# Patient Record
Sex: Female | Born: 1970 | Race: White | Hispanic: No | Marital: Married | Smoking: Former smoker
Health system: Southern US, Community
[De-identification: ages and names within clinical notes are randomized; demographics above are authoritative.]

## PROBLEM LIST (undated history)

## (undated) DIAGNOSIS — R002 Palpitations: Secondary | ICD-10-CM

## (undated) DIAGNOSIS — E119 Type 2 diabetes mellitus without complications: Secondary | ICD-10-CM

## (undated) DIAGNOSIS — E785 Hyperlipidemia, unspecified: Secondary | ICD-10-CM

## (undated) DIAGNOSIS — D649 Anemia, unspecified: Secondary | ICD-10-CM

## (undated) HISTORY — DX: Palpitations: R00.2

## (undated) HISTORY — PX: ENDOMETRIAL BIOPSY: SHX622

## (undated) HISTORY — DX: Hyperlipidemia, unspecified: E78.5

## (undated) HISTORY — DX: Type 2 diabetes mellitus without complications: E11.9

## (undated) HISTORY — DX: Anemia, unspecified: D64.9

---

## 2003-11-20 ENCOUNTER — Ambulatory Visit (HOSPITAL_COMMUNITY): Admission: RE | Admit: 2003-11-20 | Discharge: 2003-11-20 | Payer: Self-pay | Admitting: Gynecology

## 2003-11-20 ENCOUNTER — Encounter (INDEPENDENT_AMBULATORY_CARE_PROVIDER_SITE_OTHER): Payer: Self-pay | Admitting: Specialist

## 2004-01-30 ENCOUNTER — Inpatient Hospital Stay (HOSPITAL_COMMUNITY): Admission: AD | Admit: 2004-01-30 | Discharge: 2004-02-01 | Payer: Self-pay | Admitting: Gynecology

## 2004-02-13 ENCOUNTER — Ambulatory Visit: Payer: Self-pay | Admitting: Gynecology

## 2004-03-02 ENCOUNTER — Ambulatory Visit: Payer: Self-pay | Admitting: Gynecology

## 2004-04-02 ENCOUNTER — Ambulatory Visit: Payer: Self-pay | Admitting: Gynecology

## 2004-04-24 ENCOUNTER — Ambulatory Visit (HOSPITAL_COMMUNITY): Admission: RE | Admit: 2004-04-24 | Discharge: 2004-04-24 | Payer: Self-pay | Admitting: Gynecology

## 2004-04-25 ENCOUNTER — Encounter (INDEPENDENT_AMBULATORY_CARE_PROVIDER_SITE_OTHER): Payer: Self-pay | Admitting: Specialist

## 2004-04-25 ENCOUNTER — Ambulatory Visit (HOSPITAL_COMMUNITY): Admission: RE | Admit: 2004-04-25 | Discharge: 2004-04-25 | Payer: Self-pay | Admitting: Gynecology

## 2005-08-30 ENCOUNTER — Encounter: Payer: Self-pay | Admitting: Family Medicine

## 2005-08-30 LAB — CONVERTED CEMR LAB: Pap Smear: NORMAL

## 2005-09-22 ENCOUNTER — Encounter (INDEPENDENT_AMBULATORY_CARE_PROVIDER_SITE_OTHER): Payer: Self-pay | Admitting: *Deleted

## 2005-09-22 ENCOUNTER — Ambulatory Visit: Payer: Self-pay | Admitting: Gynecology

## 2005-10-08 ENCOUNTER — Ambulatory Visit: Payer: Self-pay | Admitting: Obstetrics & Gynecology

## 2005-10-22 ENCOUNTER — Ambulatory Visit: Payer: Self-pay | Admitting: Obstetrics & Gynecology

## 2005-12-21 ENCOUNTER — Ambulatory Visit: Payer: Self-pay | Admitting: Family Medicine

## 2005-12-31 ENCOUNTER — Encounter: Payer: Self-pay | Admitting: Family Medicine

## 2005-12-31 LAB — CONVERTED CEMR LAB: Hgb A1c MFr Bld: 5.6 %

## 2006-01-07 ENCOUNTER — Ambulatory Visit: Payer: Self-pay | Admitting: Family Medicine

## 2006-02-02 ENCOUNTER — Ambulatory Visit: Payer: Self-pay | Admitting: Family Medicine

## 2006-04-08 ENCOUNTER — Ambulatory Visit: Payer: Self-pay | Admitting: Obstetrics & Gynecology

## 2006-04-16 ENCOUNTER — Ambulatory Visit (HOSPITAL_COMMUNITY): Admission: RE | Admit: 2006-04-16 | Discharge: 2006-04-16 | Payer: Self-pay | Admitting: Gynecology

## 2006-05-06 ENCOUNTER — Ambulatory Visit: Payer: Self-pay | Admitting: Obstetrics & Gynecology

## 2006-05-06 ENCOUNTER — Encounter: Payer: Self-pay | Admitting: Obstetrics & Gynecology

## 2006-05-10 ENCOUNTER — Ambulatory Visit: Payer: Self-pay | Admitting: Obstetrics & Gynecology

## 2006-05-13 ENCOUNTER — Ambulatory Visit: Payer: Self-pay | Admitting: Obstetrics & Gynecology

## 2006-05-18 ENCOUNTER — Ambulatory Visit: Payer: Self-pay | Admitting: Family Medicine

## 2006-05-28 ENCOUNTER — Ambulatory Visit (HOSPITAL_COMMUNITY): Admission: RE | Admit: 2006-05-28 | Discharge: 2006-05-28 | Payer: Self-pay | Admitting: Gynecology

## 2006-06-01 ENCOUNTER — Ambulatory Visit (HOSPITAL_COMMUNITY): Admission: RE | Admit: 2006-06-01 | Discharge: 2006-06-01 | Payer: Self-pay | Admitting: Gynecology

## 2006-06-01 ENCOUNTER — Ambulatory Visit: Payer: Self-pay | Admitting: Family Medicine

## 2006-06-14 ENCOUNTER — Ambulatory Visit: Payer: Self-pay | Admitting: Gynecology

## 2006-06-28 ENCOUNTER — Ambulatory Visit: Payer: Self-pay | Admitting: Gynecology

## 2006-07-09 ENCOUNTER — Ambulatory Visit (HOSPITAL_COMMUNITY): Admission: RE | Admit: 2006-07-09 | Discharge: 2006-07-09 | Payer: Self-pay | Admitting: Gynecology

## 2006-07-19 ENCOUNTER — Ambulatory Visit: Payer: Self-pay | Admitting: Gynecology

## 2006-07-25 ENCOUNTER — Ambulatory Visit: Payer: Self-pay | Admitting: Obstetrics & Gynecology

## 2006-08-02 ENCOUNTER — Ambulatory Visit: Payer: Self-pay | Admitting: Gynecology

## 2006-08-09 ENCOUNTER — Ambulatory Visit: Payer: Self-pay | Admitting: Obstetrics & Gynecology

## 2006-08-16 ENCOUNTER — Ambulatory Visit: Payer: Self-pay | Admitting: Gynecology

## 2006-08-23 ENCOUNTER — Ambulatory Visit: Payer: Self-pay | Admitting: Gynecology

## 2006-09-06 ENCOUNTER — Ambulatory Visit: Payer: Self-pay | Admitting: Gynecology

## 2006-09-10 ENCOUNTER — Ambulatory Visit (HOSPITAL_COMMUNITY): Admission: RE | Admit: 2006-09-10 | Discharge: 2006-09-10 | Payer: Self-pay | Admitting: Gynecology

## 2006-09-13 ENCOUNTER — Ambulatory Visit: Payer: Self-pay | Admitting: Gynecology

## 2006-09-20 ENCOUNTER — Ambulatory Visit: Payer: Self-pay | Admitting: Gynecology

## 2006-10-04 ENCOUNTER — Ambulatory Visit: Payer: Self-pay | Admitting: Gynecology

## 2006-10-08 ENCOUNTER — Ambulatory Visit (HOSPITAL_COMMUNITY): Admission: RE | Admit: 2006-10-08 | Discharge: 2006-10-08 | Payer: Self-pay | Admitting: Gynecology

## 2006-10-11 ENCOUNTER — Ambulatory Visit: Payer: Self-pay | Admitting: Gynecology

## 2006-10-18 ENCOUNTER — Ambulatory Visit: Payer: Self-pay | Admitting: Gynecology

## 2006-10-21 ENCOUNTER — Ambulatory Visit (HOSPITAL_COMMUNITY): Admission: RE | Admit: 2006-10-21 | Discharge: 2006-10-21 | Payer: Self-pay | Admitting: Gynecology

## 2006-10-25 ENCOUNTER — Ambulatory Visit: Payer: Self-pay | Admitting: Gynecology

## 2006-10-28 ENCOUNTER — Ambulatory Visit (HOSPITAL_COMMUNITY): Admission: RE | Admit: 2006-10-28 | Discharge: 2006-10-28 | Payer: Self-pay | Admitting: Gynecology

## 2006-11-02 ENCOUNTER — Ambulatory Visit: Payer: Self-pay | Admitting: Obstetrics & Gynecology

## 2006-11-04 ENCOUNTER — Ambulatory Visit (HOSPITAL_COMMUNITY): Admission: RE | Admit: 2006-11-04 | Discharge: 2006-11-04 | Payer: Self-pay | Admitting: Gynecology

## 2006-11-09 ENCOUNTER — Ambulatory Visit: Payer: Self-pay | Admitting: Obstetrics & Gynecology

## 2006-11-11 ENCOUNTER — Ambulatory Visit (HOSPITAL_COMMUNITY): Admission: RE | Admit: 2006-11-11 | Discharge: 2006-11-11 | Payer: Self-pay | Admitting: Gynecology

## 2006-11-16 ENCOUNTER — Ambulatory Visit: Payer: Self-pay | Admitting: Family Medicine

## 2006-11-18 ENCOUNTER — Ambulatory Visit (HOSPITAL_COMMUNITY): Admission: RE | Admit: 2006-11-18 | Discharge: 2006-11-18 | Payer: Self-pay | Admitting: Gynecology

## 2006-11-23 ENCOUNTER — Ambulatory Visit: Payer: Self-pay | Admitting: Family Medicine

## 2006-11-25 ENCOUNTER — Ambulatory Visit (HOSPITAL_COMMUNITY): Admission: RE | Admit: 2006-11-25 | Discharge: 2006-11-25 | Payer: Self-pay | Admitting: Gynecology

## 2006-11-29 ENCOUNTER — Ambulatory Visit: Payer: Self-pay | Admitting: Gynecology

## 2006-12-01 LAB — CONVERTED CEMR LAB: Pap Smear: NORMAL

## 2006-12-02 ENCOUNTER — Inpatient Hospital Stay (HOSPITAL_COMMUNITY): Admission: RE | Admit: 2006-12-02 | Discharge: 2006-12-04 | Payer: Self-pay | Admitting: Family Medicine

## 2006-12-02 ENCOUNTER — Ambulatory Visit: Payer: Self-pay | Admitting: Family Medicine

## 2006-12-02 ENCOUNTER — Encounter: Payer: Self-pay | Admitting: Family Medicine

## 2006-12-09 ENCOUNTER — Ambulatory Visit: Payer: Self-pay | Admitting: Obstetrics & Gynecology

## 2007-05-13 ENCOUNTER — Encounter: Payer: Self-pay | Admitting: Family Medicine

## 2007-05-30 ENCOUNTER — Encounter: Payer: Self-pay | Admitting: Family Medicine

## 2007-07-14 ENCOUNTER — Encounter: Payer: Self-pay | Admitting: Family Medicine

## 2007-07-14 ENCOUNTER — Ambulatory Visit: Payer: Self-pay | Admitting: Family Medicine

## 2007-07-14 DIAGNOSIS — E11319 Type 2 diabetes mellitus with unspecified diabetic retinopathy without macular edema: Secondary | ICD-10-CM | POA: Insufficient documentation

## 2007-07-14 DIAGNOSIS — E1169 Type 2 diabetes mellitus with other specified complication: Secondary | ICD-10-CM | POA: Insufficient documentation

## 2007-07-14 DIAGNOSIS — E119 Type 2 diabetes mellitus without complications: Secondary | ICD-10-CM | POA: Insufficient documentation

## 2007-07-14 DIAGNOSIS — Z862 Personal history of diseases of the blood and blood-forming organs and certain disorders involving the immune mechanism: Secondary | ICD-10-CM | POA: Insufficient documentation

## 2007-07-14 DIAGNOSIS — E785 Hyperlipidemia, unspecified: Secondary | ICD-10-CM

## 2007-07-19 ENCOUNTER — Ambulatory Visit: Payer: Self-pay | Admitting: Family Medicine

## 2007-07-27 LAB — CONVERTED CEMR LAB
ALT: 15 units/L (ref 0–35)
Alkaline Phosphatase: 58 units/L (ref 39–117)
Bilirubin, Direct: 0.1 mg/dL (ref 0.0–0.3)
CO2: 27 meq/L (ref 19–32)
Chloride: 106 meq/L (ref 96–112)
Direct LDL: 143.9 mg/dL
Ferritin: 26.8 ng/mL (ref 10.0–291.0)
Glucose, Bld: 99 mg/dL (ref 70–99)
Hgb A1c MFr Bld: 6.3 % — ABNORMAL HIGH (ref 4.6–6.0)
Microalb Creat Ratio: 12.1 mg/g (ref 0.0–30.0)
Potassium: 3.8 meq/L (ref 3.5–5.1)
Saturation Ratios: 26.5 % (ref 20.0–50.0)
Sodium: 142 meq/L (ref 135–145)
Total Protein: 7.3 g/dL (ref 6.0–8.3)
Transferrin: 269.1 mg/dL (ref >=212.0)

## 2007-10-02 IMAGING — US US FETAL BPP W/O NONSTRESS
1 series · 14 of 27 positions shown · non-contrast
Comparison: none

OBSTETRICAL ULTRASOUND:
 This ultrasound was performed in The [HOSPITAL], and the [facility] report will be stored to [REDACTED] PACS.

[Series 1: us fetal bpp w/o nonstress · 14 of 27 slices shown]
[im 1/27]
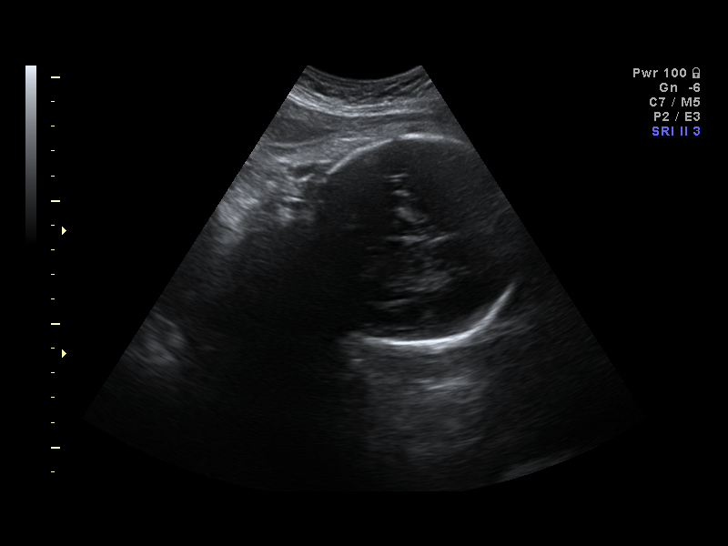
[im 3/27]
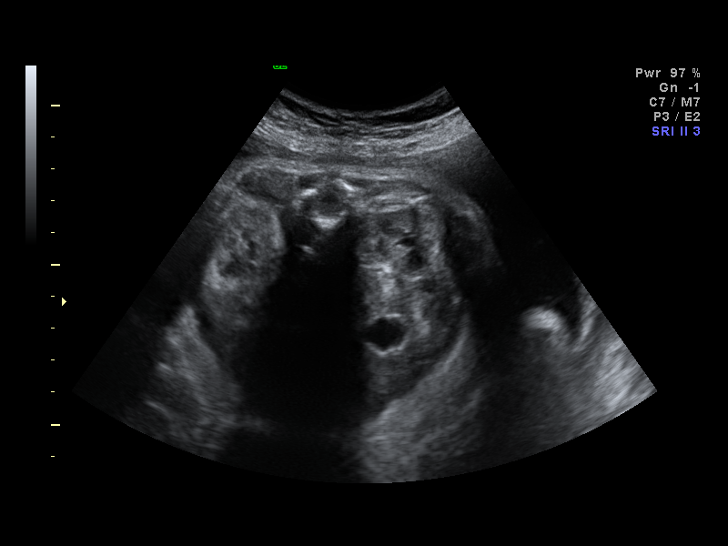
[im 5/27]
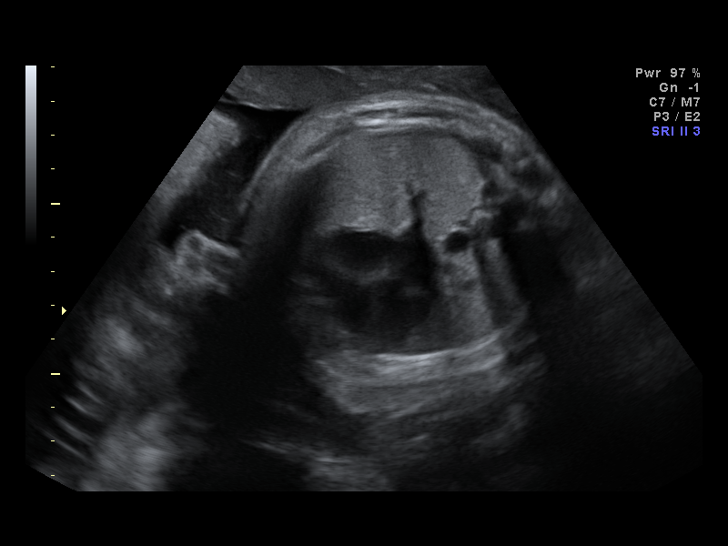
[im 7/27]
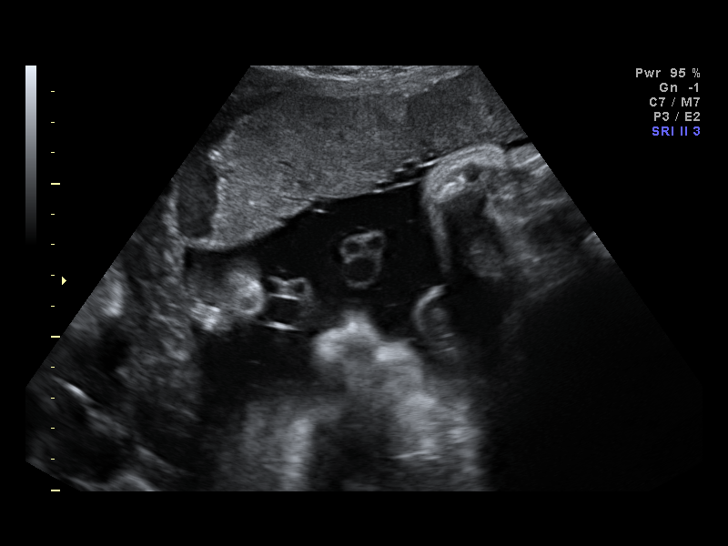
[im 9/27]
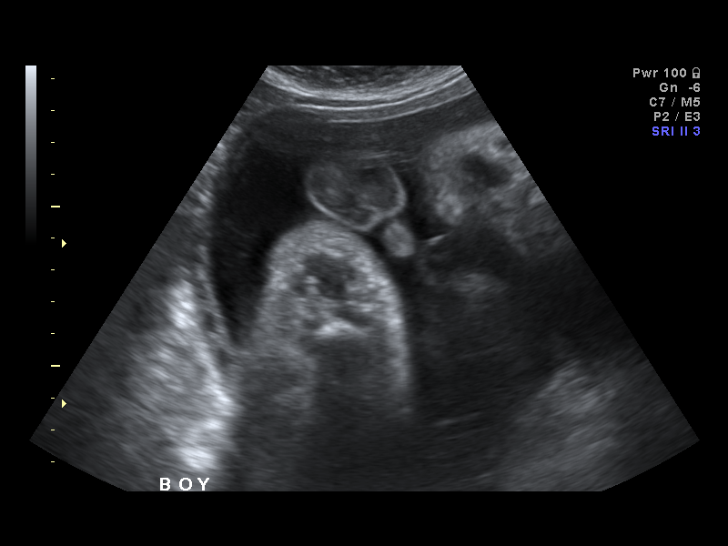
[im 11/27]
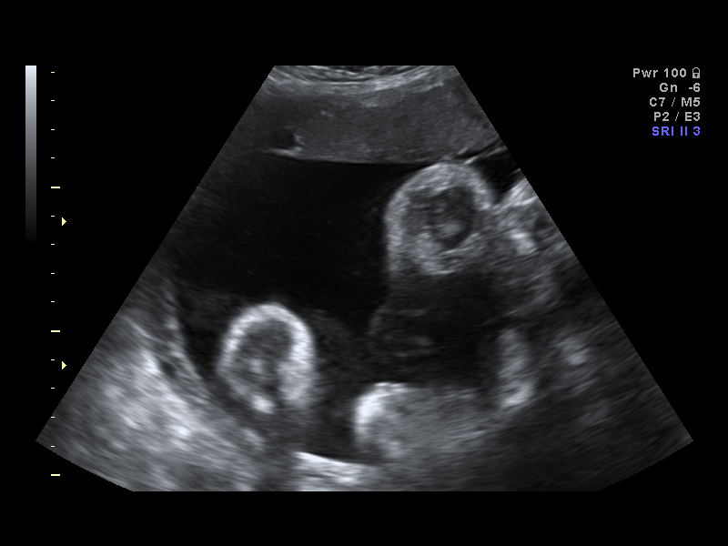
[im 13/27]
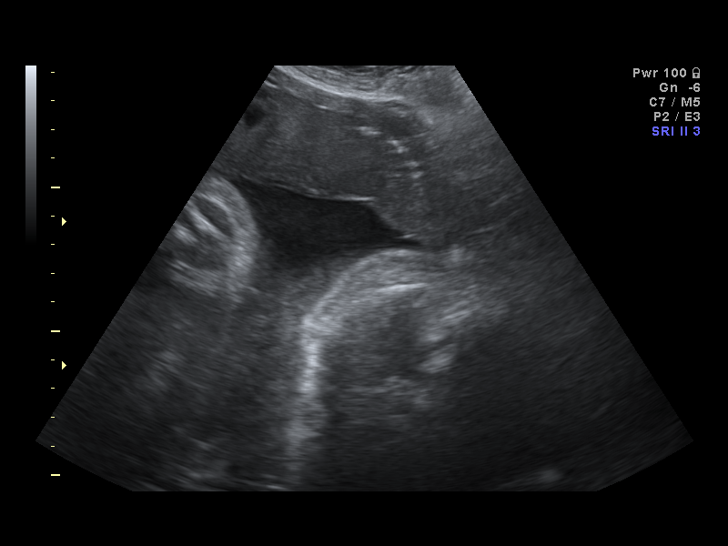
[im 15/27]
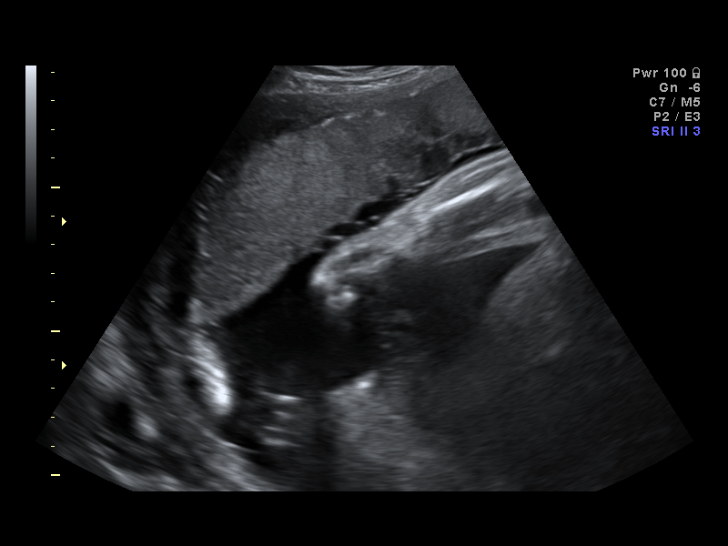
[im 17/27]
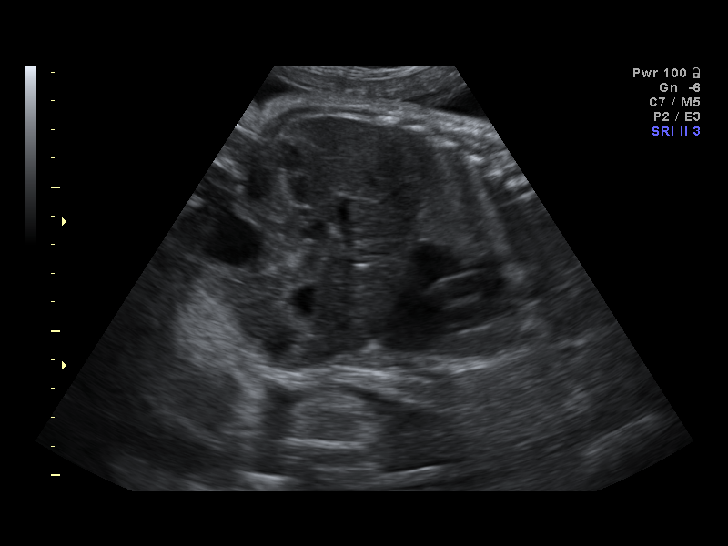
[im 19/27]
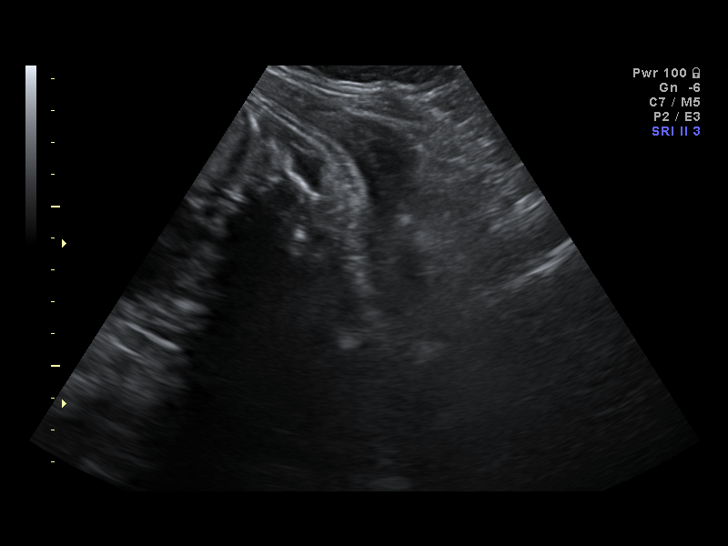
[im 21/27]
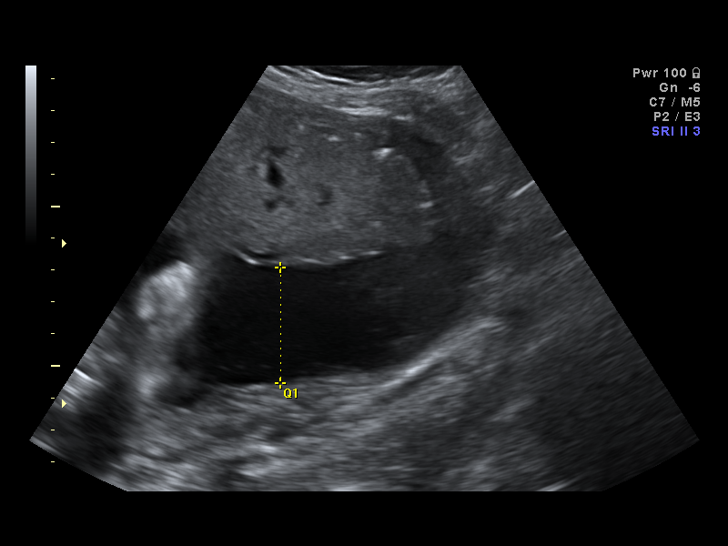
[im 23/27]
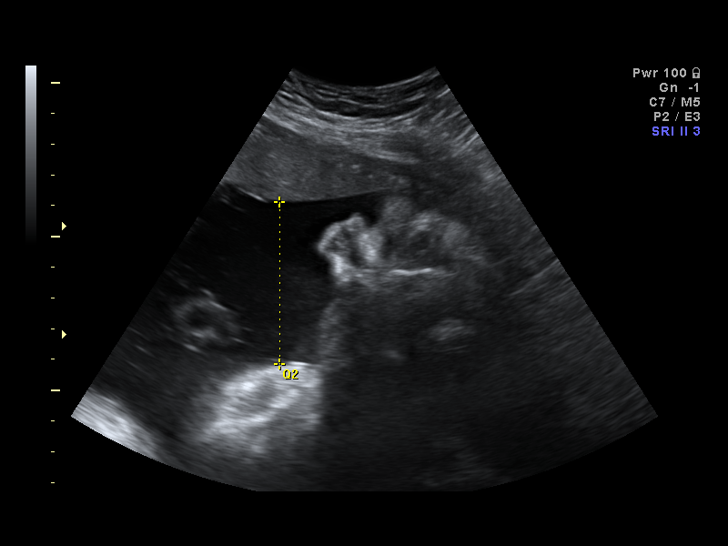
[im 25/27]
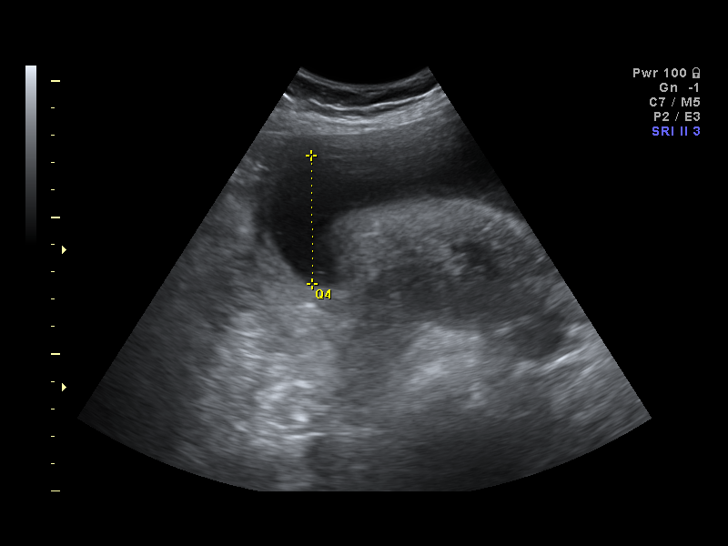
[im 27/27]
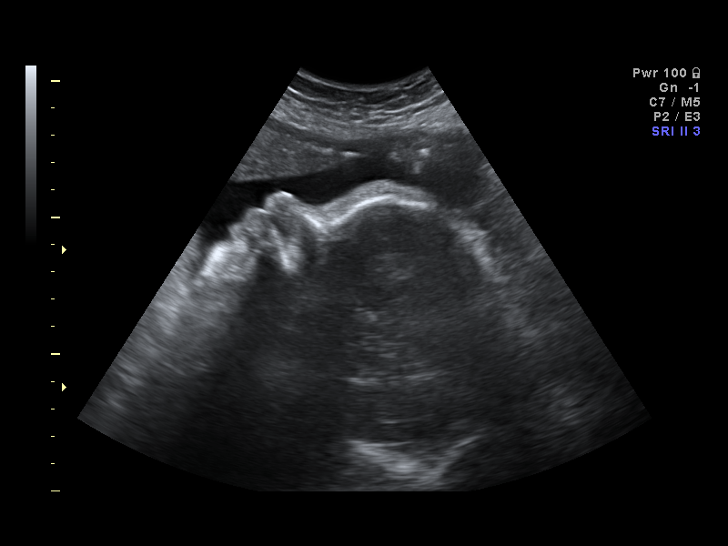

[14 of 27 positions shown; findings below may reference images not displayed]

IMPRESSION: The [facility] report has also been faxed to the ordering physician.

## 2007-10-12 ENCOUNTER — Encounter (INDEPENDENT_AMBULATORY_CARE_PROVIDER_SITE_OTHER): Payer: Self-pay | Admitting: *Deleted

## 2007-10-27 ENCOUNTER — Ambulatory Visit: Payer: Self-pay | Admitting: Family Medicine

## 2007-10-30 IMAGING — US US FETAL BPP W/O NONSTRESS
1 series · 14 of 18 positions shown · non-contrast
Comparison: none

OBSTETRICAL ULTRASOUND:
 This ultrasound was performed in The [HOSPITAL], and the [facility] report will be stored to [REDACTED] PACS.

[Series 1: us fetal bpp w/o nonstress · 14 of 18 slices shown]
[im 1/18]
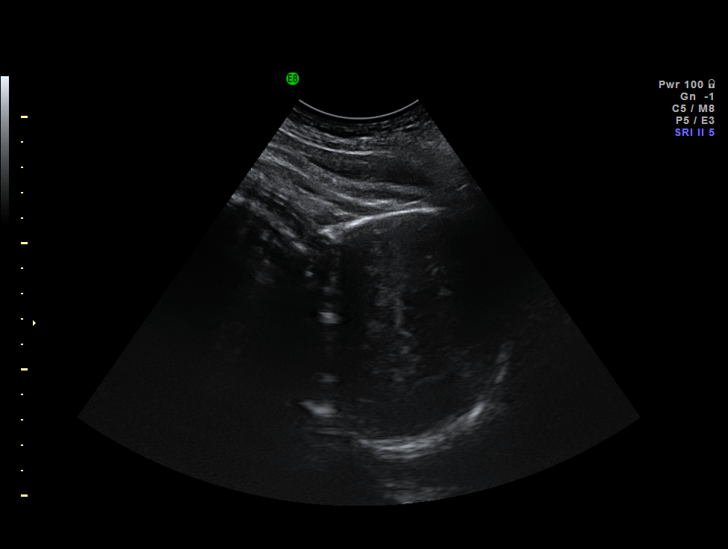
[im 2/18]
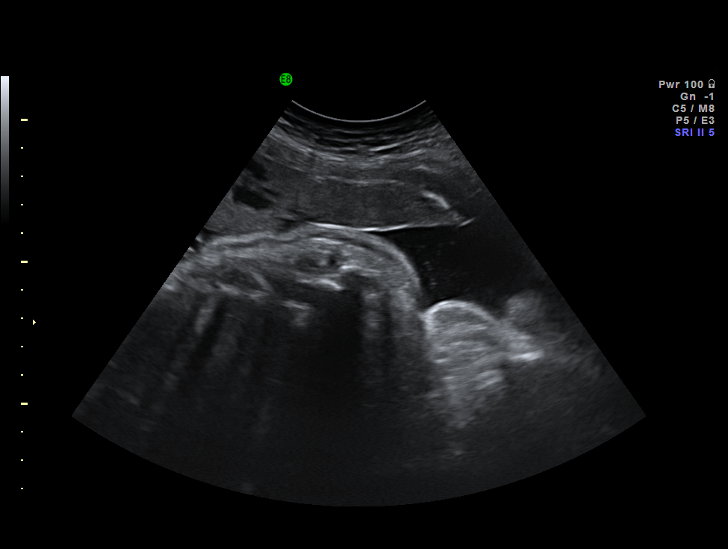
[im 4/18]
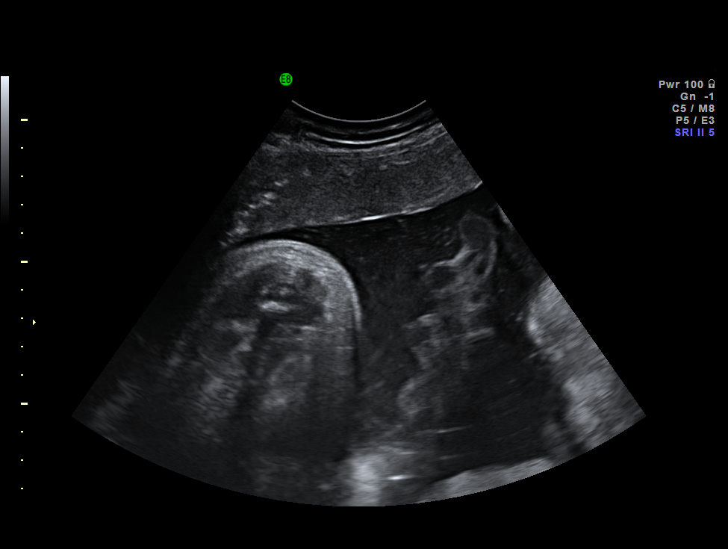
[im 5/18]
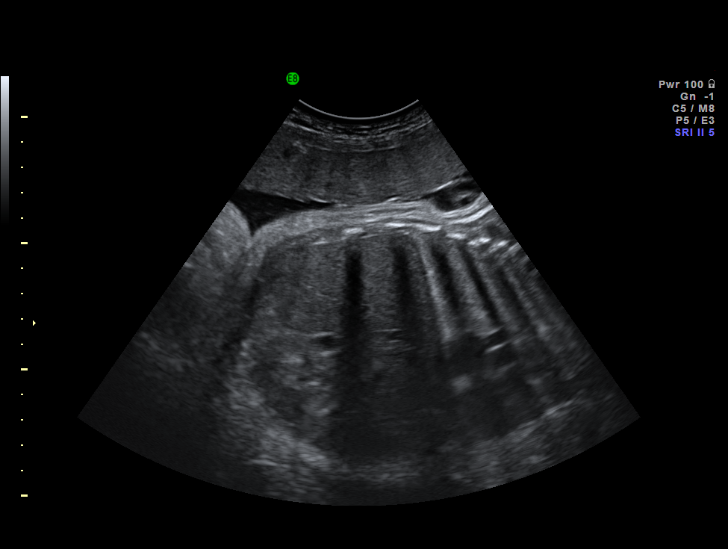
[im 6/18]
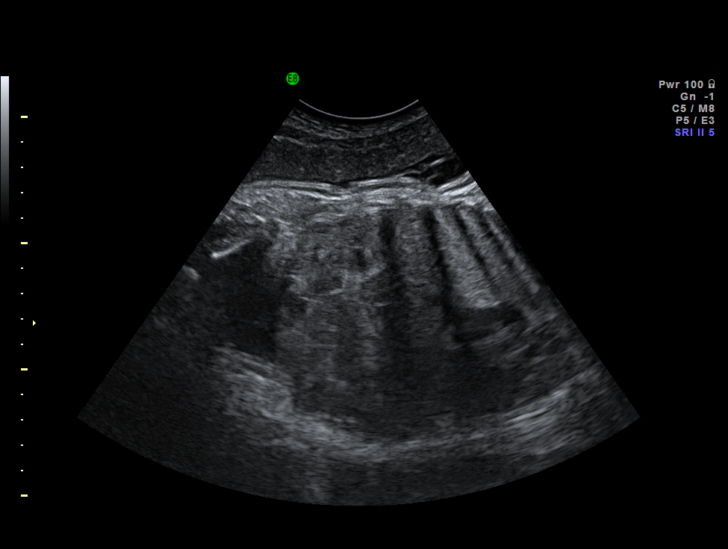
[im 8/18]
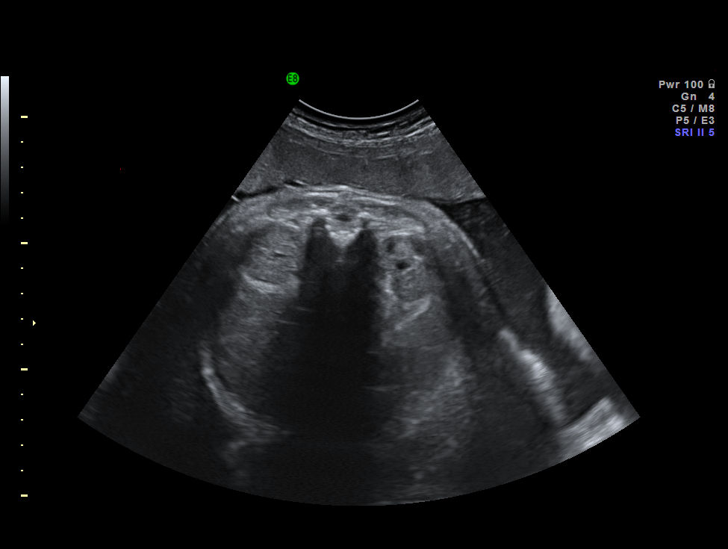
[im 9/18]
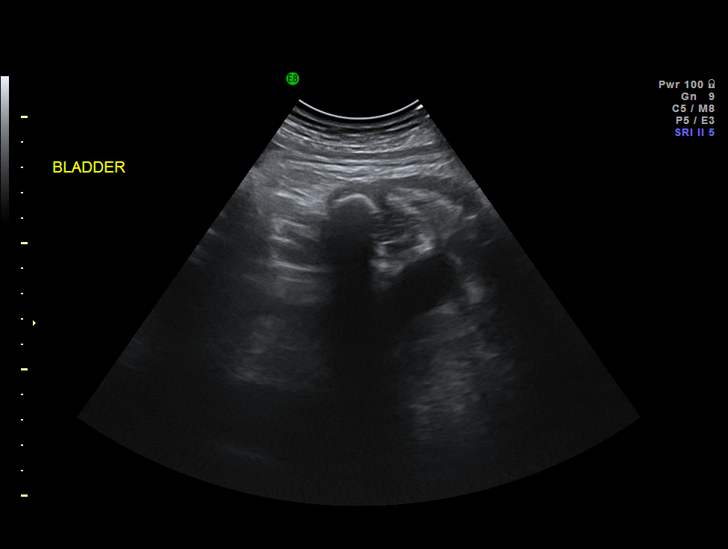
[im 10/18]
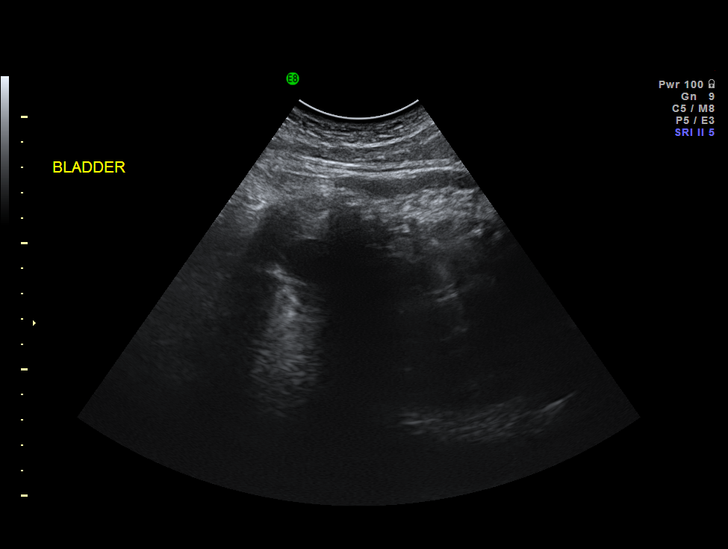
[im 11/18]
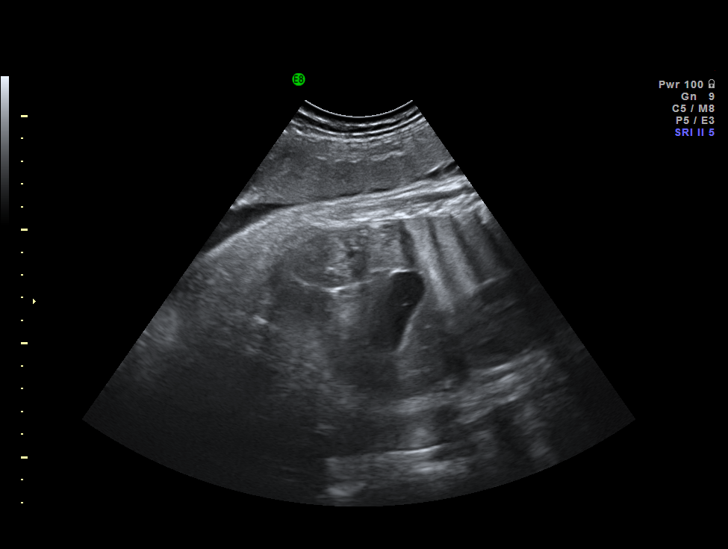
[im 13/18]
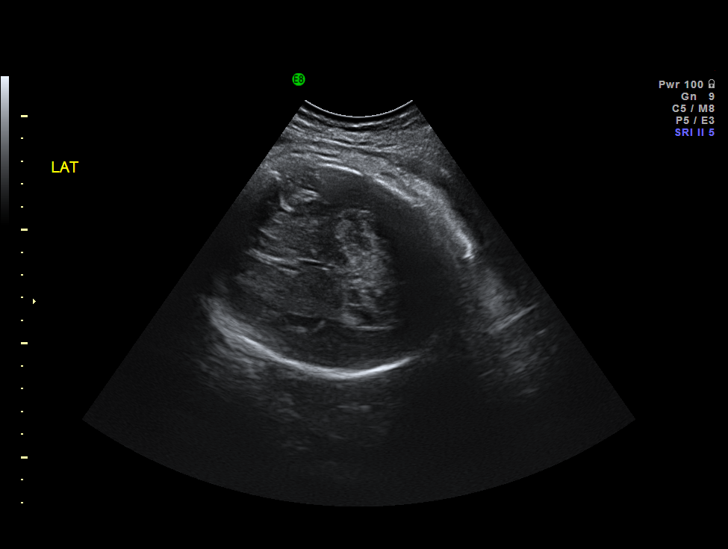
[im 14/18]
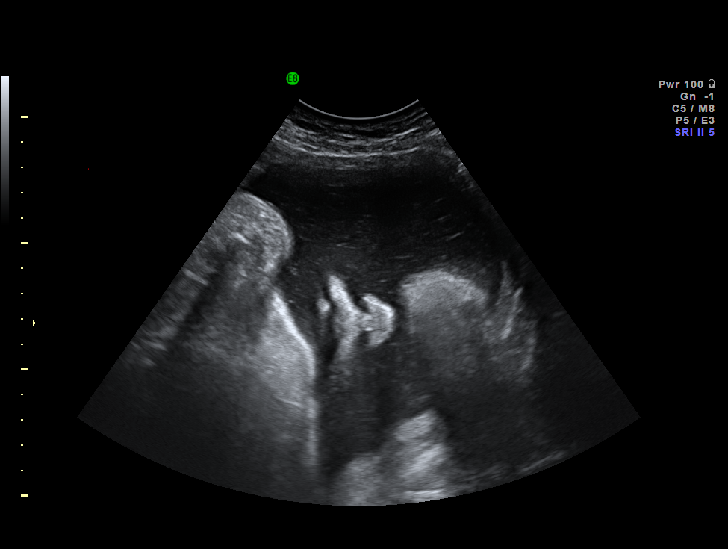
[im 15/18]
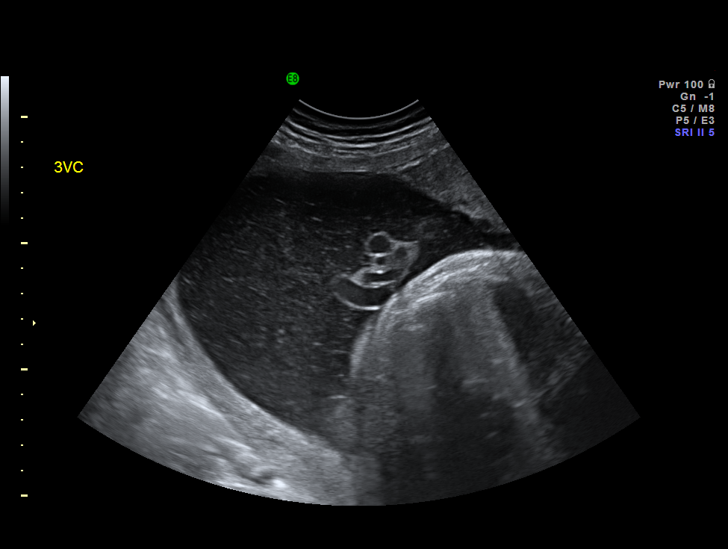
[im 17/18]
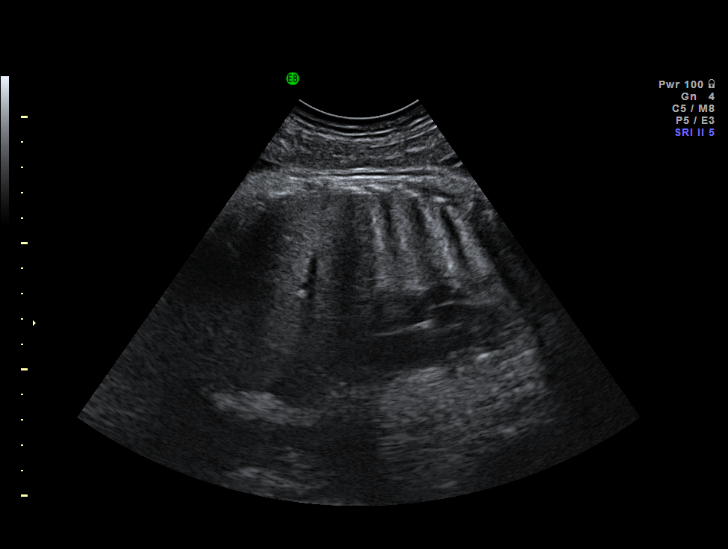
[im 18/18]
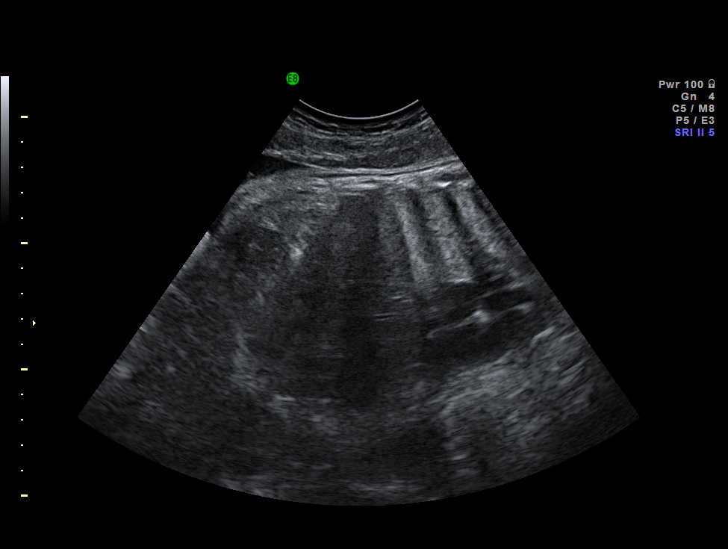

[14 of 18 positions shown; findings below may reference images not displayed]

IMPRESSION: The [facility] report has also been faxed to the ordering physician.

## 2007-11-14 LAB — CONVERTED CEMR LAB
ALT: 9 units/L (ref 0–35)
AST: 12 units/L (ref 0–37)
LDL Cholesterol: 112 mg/dL — ABNORMAL HIGH (ref 0–99)
Total CHOL/HDL Ratio: 2.8

## 2008-02-13 ENCOUNTER — Encounter (INDEPENDENT_AMBULATORY_CARE_PROVIDER_SITE_OTHER): Payer: Self-pay | Admitting: *Deleted

## 2008-05-15 ENCOUNTER — Ambulatory Visit: Payer: Self-pay | Admitting: Family Medicine

## 2008-06-21 ENCOUNTER — Ambulatory Visit: Payer: Self-pay | Admitting: Family Medicine

## 2008-09-11 ENCOUNTER — Ambulatory Visit: Payer: Self-pay | Admitting: Family Medicine

## 2008-09-11 LAB — CONVERTED CEMR LAB
ALT: 11 units/L (ref 0–35)
AST: 13 units/L (ref 0–37)
Albumin: 3.9 g/dL (ref 3.5–5.2)
Alkaline Phosphatase: 49 units/L (ref 39–117)
Bilirubin, Direct: 0.1 mg/dL (ref 0.0–0.3)
CO2: 27 meq/L (ref 19–32)
Calcium: 8.9 mg/dL (ref 8.4–10.5)
Chloride: 105 meq/L (ref 96–112)
Creatinine,U: 256.3 mg/dL
Direct LDL: 135.6 mg/dL
Glucose, Bld: 175 mg/dL — ABNORMAL HIGH (ref 70–99)
LDL Cholesterol: 135.6 mg/dL
LDL Cholesterol: 135.6 mg/dL
Sodium: 139 meq/L (ref 135–145)
Total Protein: 7 g/dL (ref 6.0–8.3)
Triglycerides: 77 mg/dL (ref 0.0–149.0)

## 2008-09-17 ENCOUNTER — Encounter: Payer: Self-pay | Admitting: Family Medicine

## 2008-09-17 ENCOUNTER — Ambulatory Visit: Payer: Self-pay | Admitting: Family Medicine

## 2008-09-17 ENCOUNTER — Other Ambulatory Visit: Admission: RE | Admit: 2008-09-17 | Discharge: 2008-09-17 | Payer: Self-pay | Admitting: Family Medicine

## 2008-09-20 ENCOUNTER — Encounter (INDEPENDENT_AMBULATORY_CARE_PROVIDER_SITE_OTHER): Payer: Self-pay | Admitting: *Deleted

## 2008-12-26 ENCOUNTER — Ambulatory Visit: Payer: Self-pay | Admitting: Family Medicine

## 2008-12-26 LAB — CONVERTED CEMR LAB
ALT: 11 units/L (ref 0–35)
AST: 13 units/L (ref 0–37)
Alkaline Phosphatase: 32 units/L — ABNORMAL LOW (ref 39–117)
Bilirubin, Direct: 0.1 mg/dL (ref 0.0–0.3)
Chloride: 107 meq/L (ref 96–112)
GFR calc non Af Amer: 148.31 mL/min (ref >60)
Hgb A1c MFr Bld: 6.4 % (ref 4.6–6.5)
Potassium: 3.7 meq/L (ref 3.5–5.1)
Sodium: 141 meq/L (ref 135–145)
Total Bilirubin: 1 mg/dL (ref 0.3–1.2)
Total CHOL/HDL Ratio: 4
VLDL: 8.4 mg/dL (ref 0.0–40.0)

## 2009-01-01 ENCOUNTER — Ambulatory Visit: Payer: Self-pay | Admitting: Family Medicine

## 2009-01-01 LAB — CONVERTED CEMR LAB: LDL Cholesterol: 135.6 mg/dL

## 2009-04-15 ENCOUNTER — Encounter (INDEPENDENT_AMBULATORY_CARE_PROVIDER_SITE_OTHER): Payer: Self-pay | Admitting: *Deleted

## 2009-08-02 ENCOUNTER — Ambulatory Visit: Payer: Self-pay | Admitting: Family Medicine

## 2009-08-02 LAB — CONVERTED CEMR LAB
Cholesterol: 238 mg/dL — ABNORMAL HIGH (ref 0–200)
Direct LDL: 154.9 mg/dL
Total CHOL/HDL Ratio: 4
VLDL: 17 mg/dL (ref 0.0–40.0)

## 2009-08-07 ENCOUNTER — Ambulatory Visit: Payer: Self-pay | Admitting: Family Medicine

## 2009-08-07 DIAGNOSIS — E282 Polycystic ovarian syndrome: Secondary | ICD-10-CM | POA: Insufficient documentation

## 2009-11-01 ENCOUNTER — Ambulatory Visit: Payer: Self-pay | Admitting: Family Medicine

## 2009-11-01 LAB — CONVERTED CEMR LAB
AST: 14 units/L (ref 0–37)
Albumin: 3.8 g/dL (ref 3.5–5.2)
Alkaline Phosphatase: 42 units/L (ref 39–117)
Bilirubin, Direct: 0.1 mg/dL (ref 0.0–0.3)
CO2: 26 meq/L (ref 19–32)
Calcium: 8.7 mg/dL (ref 8.4–10.5)
GFR calc non Af Amer: 158.54 mL/min (ref >60)
Glucose, Bld: 115 mg/dL — ABNORMAL HIGH (ref 70–99)
HDL: 57.7 mg/dL (ref >39.00)
Hgb A1c MFr Bld: 7.3 % — ABNORMAL HIGH (ref 4.6–6.5)
Microalb, Ur: 3.1 mg/dL — ABNORMAL HIGH (ref 0.0–1.9)
Potassium: 3.8 meq/L (ref 3.5–5.1)
Sodium: 137 meq/L (ref 135–145)
Total CHOL/HDL Ratio: 3
VLDL: 11 mg/dL (ref 0.0–40.0)

## 2009-11-08 ENCOUNTER — Ambulatory Visit: Payer: Self-pay | Admitting: Family Medicine

## 2010-02-28 ENCOUNTER — Ambulatory Visit: Admit: 2010-02-28 | Payer: Self-pay | Admitting: Family Medicine

## 2010-03-04 ENCOUNTER — Ambulatory Visit: Admit: 2010-03-04 | Payer: Self-pay | Admitting: Family Medicine

## 2010-03-23 ENCOUNTER — Encounter: Payer: Self-pay | Admitting: Gynecology

## 2010-04-02 NOTE — Assessment & Plan Note (Signed)
Summary: F/U/CLE   Vital Signs:  Patient profile:   40 year old female Height:      65.5 inches Weight:      156.0 pounds BMI:     25.66 Temp:     98.0 degrees F oral Pulse rate:   72 / minute Pulse rhythm:   regular BP sitting:   120 / 70  (left arm) Cuff size:   regular  Vitals Entered By: Benny Lennert CMA Duncan Dull) (August 07, 2009 8:26 AM)  History of Present Illness: Chief complaint follow up  DM.Marland Kitchenworsened control again...14lb weight gain!! Continues to use Lantus on 24 UNits...FBS 125-130 Has been out of metformin for several weeks. Sometimes feels bad when she is 85-90. Not exercise.   Problems Prior to Update: 1)  Preventive Health Care  (ICD-V70.0) 2)  Anemia  (ICD-285.9) 3)  Hyperlipidemia  (ICD-272.4) 4)  Diabetes Mellitus, Type II  (ICD-250.00)  Current Medications (verified): 1)  Metformin Hcl 750 Mg Xr24h-Tab (Metformin Hcl) .... 2 Tab By Mouth Daily 2)  Truetest Test  Strp (Glucose Blood) .... Check Cbgs 3 Times Daily  Dx 250.00 3)  Lantus Solostar 100 Unit/ml Soln (Insulin Glargine) .Marland Kitchen.. 10 Units [place] Inj Daily, May Titrate Up 2 Units Every 2 Days Until Fasting Blood Sugar Less Than 120 4)  Pen Needles 31g X 8 Mm Misc (Insulin Pen Needle) .... or Needles of Pt Choice For Solostar Pen  Allergies: 1)  ! Penicillin 2)  ! Amoxicillin (Amoxicillin)  Past History:  Past medical, surgical, family and social histories (including risk factors) reviewed, and no changes noted (except as noted below).  Past Medical History: Reviewed history from 07/14/2007 and no changes required. PALPITATIONS (ICD-785.1) ANEMIA (ICD-285.9) HYPERLIPIDEMIA (ICD-272.4) DIABETES MELLITUS, TYPE II (ICD-250.00)    Past Surgical History: Reviewed history from 07/14/2007 and no changes required. 12/2006 C-section, LGA G5, P2  NSVD 2007      Endometrial biopsy (-)  Family History: Reviewed history from 07/14/2007 and no changes required. Father: Died 72 MI Mother: Alive 73, ?  arrhythmia, anxiety Siblings: 1 brother, healthy DM:  No family hx. Breast CA:  PGM, late 30's Lymphoma: PGF  Social History: Reviewed history from 07/14/2007 and no changes required. Former Smoker, 5-10 pack year history Alcohol use-yes, 2 drinks/month, glass of wine Drug use-no Regular exercise-yes, walks around twice a week Marital Status: Married x 10 years, no domestic abuse Children: 2 children, ages 5 and 37, healthy Occupation: Scientist, water quality Diet:  3 meals, limited carbs, water, fruit, veggies  Review of Systems General:  Denies fatigue and fever. CV:  Denies chest pain or discomfort. Resp:  Denies shortness of breath. GI:  Denies abdominal pain. GU:  Denies dysuria.  Physical Exam  General:  overweight female in NAd Nose:  External nasal examination shows no deformity or inflammation. Nasal mucosa are pink and moist without lesions or exudates. Mouth:  Oral mucosa and oropharynx without lesions or exudates.  Teeth in good repair. Neck:  no carotid bruit or thyromegaly no cervical or supraclavicular lymphadenopathy  Lungs:  Normal respiratory effort, chest expands symmetrically. Lungs are clear to auscultation, no crackles or wheezes. Heart:  Normal rate and regular rhythm. S1 and S2 normal without gallop, murmur, click, rub or other extra sounds. Abdomen:  Bowel sounds positive,abdomen soft and non-tender without masses, organomegaly or hernias noted. Pulses:  R and L posterior tibial pulses are full and equal bilaterally  Extremities:  no edema  Diabetes Management Exam:    Foot  Exam (with socks and/or shoes not present):       Sensory-Pinprick/Light touch:          Left medial foot (L-4): normal          Left dorsal foot (L-5): normal          Left lateral foot (S-1): normal          Right medial foot (L-4): normal          Right dorsal foot (L-5): normal          Right lateral foot (S-1): normal       Sensory-Monofilament:          Left foot: normal           Right foot: normal       Inspection:          Left foot: normal          Right foot: normal       Nails:          Left foot: normal          Right foot: normal    Eye Exam:       Eye Exam done elsewhere          Date: 03/02/2009          Results: nml          Done by: eye MD   Impression & Recommendations:  Problem # 1:  DIABETES MELLITUS, TYPE II (ICD-250.00) Poor control. Restart metformin. Get back on track with diet and work aggressively on weight loss. Discussed not feeding insulin.Marland Kitchentolerating nml CBG levels.  Her updated medication list for this problem includes:    Metformin Hcl 750 Mg Xr24h-tab (Metformin hcl) .Marland Kitchen... 2 tab by mouth daily    Lantus Solostar 100 Unit/ml Soln (Insulin glargine) .Marland KitchenMarland KitchenMarland KitchenMarland Kitchen 10 units [place] inj daily, may titrate up 2 units every 2 days until fasting blood sugar less than 120  Problem # 2:  HYPERLIPIDEMIA (ICD-272.4) Inadequate control. She is hesitant about medicaiton..start low and increase as needed.  Her updated medication list for this problem includes:    Pravastatin Sodium 20 Mg Tabs (Pravastatin sodium) .Marland Kitchen... 1 tab by mouth daily  Labs Reviewed: SGOT: 13 (12/26/2008)   SGPT: 11 (12/26/2008)   HDL:64.60 (08/02/2009), 55.20 (12/26/2008)  LDL:135.6 (01/01/2009), 135.6 (09/11/2008)  Chol:238 (08/02/2009), 201 (12/26/2008)  Trig:85.0 (08/02/2009), 42.0 (12/26/2008)  Complete Medication List: 1)  Metformin Hcl 750 Mg Xr24h-tab (Metformin hcl) .... 2 tab by mouth daily 2)  Truetest Test Strp (Glucose blood) .... Check cbgs 3 times daily  dx 250.00 3)  Lantus Solostar 100 Unit/ml Soln (Insulin glargine) .Marland Kitchen.. 10 units [place] inj daily, may titrate up 2 units every 2 days until fasting blood sugar less than 120 4)  Pen Needles 31g X 8 Mm Misc (Insulin pen needle) .... Or needles of pt choice for solostar pen 5)  Pravastatin Sodium 20 Mg Tabs (Pravastatin sodium) .Marland Kitchen.. 1 tab by mouth daily  Patient Instructions: 1)  Please schedule a follow-up appointment in  3 months CPX.  2)  START PRAVASTAIN, restart metformin.  3)  Work on exercise and weight loss.  4)  BMP prior to visit, ICD-9: 250.00 5)  Hepatic Panel prior to visit ICD-9:  6)  Lipid panel prior to visit ICD-9 :  7)  HgBA1c prior to visit  ICD-9:  8)  Urine Microalbumin prior to visit ICD-9 :  Prescriptions: PRAVASTATIN SODIUM 20 MG TABS (PRAVASTATIN SODIUM)  1 tab by mouth daily  #30 x 11   Entered and Authorized by:   Kerby Nora MD   Signed by:   Kerby Nora MD on 08/07/2009   Method used:   Electronically to        [facility]. # [phone]* (retail)       [address]       [place], [place]  [address]       Ph: [phone]       Fax: [phone]   RxID:   [id number] METFORMIN HCL 750 MG XR24H-TAB (METFORMIN HCL) 2 tab by mouth daily  #60 x 11   Entered and Authorized by:   Kerby Nora MD   Signed by:   Kerby Nora MD on 08/07/2009   Method used:   Electronically to        [facility]. # [phone]* (retail)       [address]       [place], [place]  [address]       Ph: [phone]       Fax: [phone]   RxID:   [id number]   Current Allergies (reviewed today): ! PENICILLIN ! AMOXICILLIN (AMOXICILLIN)

## 2010-04-02 NOTE — Letter (Signed)
Summary: [place] No Show Letter  [place] [facility]  [address] [place], [place] [address]   Phone: [phone]  Fax: [phone]    04/15/2009 MRN: [id number]  LATRIECE ANSTINE [address] [place], [place]  [address]   Dear Ms. Harr,   Our records indicate that you missed your scheduled appointment with __Lab___________________ on _2.14.11___________.  Please contact this office to reschedule your appointment as soon as possible.  It is important that you keep your scheduled appointments with your physician, so we can provide you the best care possible.  Please be advised that there may be a charge for "no show" appointments.    Sincerely,    at [facility]

## 2010-04-02 NOTE — Assessment & Plan Note (Signed)
Summary: ROA FOR 3 MONTH FOLLOW-UP/JRR   Vital Signs:  Patient profile:   40 year old female Weight:      161.5 pounds Temp:     98.7 degrees F oral Pulse rate:   96 / minute Pulse rhythm:   regular BP sitting:   120 / 76  (right arm) Cuff size:   regular  Vitals Entered By: Lowella Petties CMA (November 08, 2009 4:48 PM) CC: 3 month follow up   History of Present Illness: DM: on Lantus 20-22 UNits daily. Started back on metformin in last 3 months.  No low CBGs since last OV.   Has been eating better overall.  HAs been exercising 2-3 times a week in last 2-3 weeks. Has had 5 lb weight gain since last OV.     High cholesterol: Has started on pravastatin.  NO SE.   Problems Prior to Update: 1)  Polycystic Ovarian Disease  (ICD-256.4) 2)  Preventive Health Care  (ICD-V70.0) 3)  Anemia  (ICD-285.9) 4)  Hyperlipidemia  (ICD-272.4) 5)  Diabetes Mellitus, Type II  (ICD-250.00)  Current Medications (verified): 1)  Metformin Hcl 750 Mg Xr24h-Tab (Metformin Hcl) .... 2 Tab By Mouth Daily 2)  Truetest Test  Strp (Glucose Blood) .... Check Cbgs 3 Times Daily  Dx 250.00 3)  Lantus Solostar 100 Unit/ml Soln (Insulin Glargine) .Marland Kitchen.. 10 Units [place] Inj Daily, May Titrate Up 2 Units Every 2 Days Until Fasting Blood Sugar Less Than 120 4)  Pen Needles 31g X 8 Mm Misc (Insulin Pen Needle) .... or Needles of Pt Choice For Solostar Pen 5)  Pravastatin Sodium 20 Mg Tabs (Pravastatin Sodium) .Marland Kitchen.. 1 Tab By Mouth Daily  Allergies (verified): 1)  ! Penicillin 2)  ! Amoxicillin (Amoxicillin)  Past History:  Past medical, surgical, family and social histories (including risk factors) reviewed, and no changes noted (except as noted below).  Past Medical History: Reviewed history from 07/14/2007 and no changes required. PALPITATIONS (ICD-785.1) ANEMIA (ICD-285.9) HYPERLIPIDEMIA (ICD-272.4) DIABETES MELLITUS, TYPE II (ICD-250.00)    Past Surgical History: Reviewed history from 07/14/2007 and no  changes required. 12/2006 C-section, LGA G5, P2  NSVD 2007      Endometrial biopsy (-)  Family History: Reviewed history from 07/14/2007 and no changes required. Father: Died 85 MI Mother: Alive 34, ? arrhythmia, anxiety Siblings: 1 brother, healthy DM:  No family hx. Breast CA:  PGM, late 30's Lymphoma: PGF  Social History: Reviewed history from 07/14/2007 and no changes required. Former Smoker, 5-10 pack year history Alcohol use-yes, 2 drinks/month, glass of wine Drug use-no Regular exercise-yes, walks around twice a week Marital Status: Married x 10 years, no domestic abuse Children: 2 children, ages 52 and 59, healthy Occupation: Scientist, water quality Diet:  3 meals, limited carbs, water, fruit, veggies  Review of Systems General:  Denies fatigue and fever. CV:  Denies chest pain or discomfort. Resp:  Denies shortness of breath. GI:  Denies abdominal pain. GU:  Denies dysuria.  Physical Exam  General:  overweight female in NAd Mouth:  Oral mucosa and oropharynx without lesions or exudates.  Teeth in good repair. Neck:  no carotid bruit or thyromegaly no cervical or supraclavicular lymphadenopathy  Lungs:  Normal respiratory effort, chest expands symmetrically. Lungs are clear to auscultation, no crackles or wheezes. Heart:  Normal rate and regular rhythm. S1 and S2 normal without gallop, murmur, click, rub or other extra sounds. Abdomen:  Bowel sounds positive,abdomen soft and non-tender without masses, organomegaly or hernias noted. Pulses:  R and L posterior tibial pulses are full and equal bilaterally  Extremities:  no edema  Diabetes Management Exam:    Foot Exam (with socks and/or shoes not present):       Sensory-Pinprick/Light touch:          Left medial foot (L-4): normal          Left dorsal foot (L-5): normal          Left lateral foot (S-1): normal          Right medial foot (L-4): normal          Right dorsal foot (L-5): normal          Right lateral foot  (S-1): normal       Sensory-Monofilament:          Left foot: normal          Right foot: normal       Inspection:          Left foot: normal          Right foot: normal       Nails:          Left foot: normal          Right foot: normal   Impression & Recommendations:  Problem # 1:  DIABETES MELLITUS, TYPE II (ICD-250.00) Inadequate control on metformin ans lantus...continue titrating up lanus until FBS at goal <120. Conitonue excellent work on lifestyle change.  Her updated medication list for this problem includes:    Metformin Hcl 750 Mg Xr24h-tab (Metformin hcl) .Marland Kitchen... 2 tab by mouth daily    Lantus Solostar 100 Unit/ml Soln (Insulin glargine) .Marland KitchenMarland KitchenMarland KitchenMarland Kitchen 10 units [place] inj daily, may titrate up 2 units every 2 days until fasting blood sugar less than 120  Labs Reviewed: Creat: 0.5 (11/01/2009)   Microalbumin: Negative (12/31/2005)  Last Eye Exam: nml (03/02/2009) Reviewed HgBA1c results: 7.3 (11/01/2009)  7.2 (08/02/2009)  Problem # 2:  HYPERLIPIDEMIA (ICD-272.4) Improved control on pravastatin but not at goal...continue lifestyle change...recheck in 3 months.  Her updated medication list for this problem includes:    Pravastatin Sodium 20 Mg Tabs (Pravastatin sodium) .Marland Kitchen... 1 tab by mouth daily  Labs Reviewed: SGOT: 14 (11/01/2009)   SGPT: 13 (11/01/2009)   HDL:57.70 (11/01/2009), 64.60 (08/02/2009)  LDL:112 (11/01/2009), 135.6 (01/01/2009)  Chol:181 (11/01/2009), 238 (08/02/2009)  Trig:55.0 (11/01/2009), 85.0 (08/02/2009)  Complete Medication List: 1)  Metformin Hcl 750 Mg Xr24h-tab (Metformin hcl) .... 2 tab by mouth daily 2)  Truetest Test Strp (Glucose blood) .... Check cbgs 3 times daily  dx 250.00 3)  Lantus Solostar 100 Unit/ml Soln (Insulin glargine) .Marland Kitchen.. 10 units [place] inj daily, may titrate up 2 units every 2 days until fasting blood sugar less than 120 4)  Pen Needles 31g X 8 Mm Misc (Insulin pen needle) .... Or needles of pt choice for solostar pen 5)  Pravastatin Sodium  20 Mg Tabs (Pravastatin sodium) .Marland Kitchen.. 1 tab by mouth daily  Patient Instructions: 1)  Please schedule a follow-up appointment in 3 months .  2)  Continue current meds.  3)  May  titrate up lantus as well ..2 Units every 2 days. 4)  Lipid panel prior to visit ICD-9 : 250.00 5)  HgBA1c prior to visit  ICD-9:   Prior Medications (reviewed today): METFORMIN HCL 750 MG XR24H-TAB (METFORMIN HCL) 2 tab by mouth daily TRUETEST TEST  STRP (GLUCOSE BLOOD) Check CBGs 3 times daily  Dx 250.00 LANTUS SOLOSTAR 100  UNIT/ML SOLN (INSULIN GLARGINE) 10 Units [place] inj daily, may titrate up 2 Units every 2 days until fasting blood sugar less than 120 PEN NEEDLES 31G X 8 MM MISC (INSULIN PEN NEEDLE) or needles of pt choice for solostar pen PRAVASTATIN SODIUM 20 MG TABS (PRAVASTATIN SODIUM) 1 tab by mouth daily Current Allergies (reviewed today): ! PENICILLIN ! AMOXICILLIN (AMOXICILLIN)

## 2010-05-08 ENCOUNTER — Encounter: Payer: Self-pay | Admitting: Family Medicine

## 2010-05-20 NOTE — Letter (Signed)
Summary: [facility]   Imported By: Kassie Mends 05/13/2010 08:37:26  _____________________________________________________________________  External Attachment:    Type:   Image     Comment:   External Document  Appended Document: Orders Update    Clinical Lists Changes  Observations: Added new observation of EYES COMMENT: 05/2011 (05/13/2010 10:58) Added new observation of DMEYEEXMRES: normal (05/13/2010 10:58) Added new observation of DIAB EYE EX: normal (05/13/2010 10:58)         Diabetes Management History:      The patient is a 40 years old female who comes in for evaluation of Type 2 Diabetes Mellitus.  She is (or has been) enrolled in the "Diabetic Education Program".  She is checking home blood sugars.  She says that she is exercising.  Type of exercise includes: walking.  Duration of exercise is estimated to be 20 min.  She is doing this 3 times per week.    Diabetes Management Exam:    Eye Exam:       Eye Exam done elsewhere          Date: 05/13/2010          Results: normal          Done by: eye MD  Diabetes Management Assessment/Plan:      The following lipid goals have been established for the patient: Total cholesterol goal of 200; LDL cholesterol goal of 100; HDL cholesterol goal of 40; Triglyceride goal of 200.  Her blood pressure goal is < 130/80.

## 2010-06-27 ENCOUNTER — Other Ambulatory Visit: Payer: Self-pay | Admitting: Family Medicine

## 2010-07-15 NOTE — Discharge Summary (Signed)
[name], [name]               ACCOUNT NO.:  [id number]   MEDICAL RECORD NO.:  [id number]          PATIENT TYPE:  POB   LOCATION:  [facility]                          FACILITY:  [facility]   PHYSICIAN:  Tanya S. Shawnie Pons, M.D.   DATE OF BIRTH:  1970-08-14   DATE OF ADMISSION:  11/29/2006  DATE OF DISCHARGE:  12/04/2006                               DISCHARGE SUMMARY   ADMISSION DIAGNOSIS:  1. Intrauterine pregnancy at [redacted] weeks gestation.  2. History of shoulder dystocia with previous pregnancy.  3. Class B Gestational diabetes.  4. Group B Streptococcus positive.  5. Anemia of pregnancy   DISCHARGE DIAGNOSIS:  1. Term pregnancy, delivered via cesarean section.  2. History of shoulder dystocia with prior pregnancy,  3. Class B gestational diabetes.  4. Group B Streptococcus positive.  5. Anemia of pregnancy.   HOSPITAL COURSE:  The patient is gravida 6, para 2-0-3-2, at 39+ weeks  gestation who presented for a primary C-section secondary to her history  of shoulder dystocia with her last pregnancy. The patient underwent a  low transverse cesarean section with bilateral tubal ligation without  complication.  She delivered a viable female infant with Apgars of 8/9,  birth weight 9 pounds 4 ounces.   Her postoperative course was unremarkable. The patient had been on  metformin 500 mg b.i.d. prior to pregnancy and was placed back on this  regimen after delivery. Her blood sugars ranged anywhere from 98-165.  Otherwise, she remained hemodynamically stable and was discharged home  on postoperative day #2.   DISCHARGE INSTRUCTIONS:  1. Follow up at [facility] office on Monday or Tuesday, October 6 or      7  to get her staples removed.  2. She is also to follow up at the [facility] office in 6 weeks for      her 6-week postpartum check.   DISCHARGE MEDICATIONS:  1. Percocet 5/825 mg, take 1 tablet every 6 hours as needed for pain.  2. Motrin 600 mg, take 1 tablet every 6 hours as needed  for pain.  3. For contraception, she is status post bilateral tubal ligation.  4. She is also to continue metformin 500 mg b.i.d.  5. Colace 100 mg p.o. b.i.d.  6. Prenatal vitamins daily.   DIET:  Modified-carbohydrate diet.   Her instructions also included no heavy lifting and pelvic rest x6  weeks.     ______________________________  Paticia Stack, MD      Shelbie Proctor. Shawnie Pons, M.D.  Electronically Signed    LNJ/MEDQ  D:  12/04/2006  T:  12/05/2006  Job:  [id number]

## 2010-07-15 NOTE — Op Note (Signed)
NAMESHANAN, FITZPATRICK               ACCOUNT NO.:  [id number]   MEDICAL RECORD NO.:  [id number]          PATIENT TYPE:  INP   LOCATION:                                [facility]:  [facility]   PHYSICIAN:  Tanya S. Shawnie Pons, M.D.   DATE OF BIRTH:  1970-05-19   DATE OF PROCEDURE:  12/02/2006  DATE OF DISCHARGE:  12/04/2006                               OPERATIVE REPORT   PREOPERATIVE DIAGNOSES:  1. Intrauterine pregnancy at 1 weeks' gestation.  2. History of shoulder dystocia in previous pregnancy.  3. Type 2 diabetes mellitus, insulin requiring during pregnancy.  4. Group B strep positive.   POSTOPERATIVE DIAGNOSES:  1. Intrauterine pregnancy at 73 weeks' gestation.  2. History of shoulder dystocia in previous pregnancy.  3. Type 2 diabetes mellitus, insulin requiring during pregnancy.  4. Group B strep positive.   PROCEDURE:  Primary low transverse cesarean section with bilateral tubal  occlusion with Filshie clips.   SURGEON:  Tinnie Gens, MD.   ASSISTANTS:  1. Karlton Lemon, MD.  2. Camelia Eng, PA student.   ANESTHESIA:  Spinal.   FINDINGS:  1. Viable infant, female, weighing 9 pounds 4 ounces with Apgars 8 at      one minute 9 at five minutes.  2. Clear-stained amniotic fluid.  3. Normal female pelvic anatomy with normal decidual changes.   ESTIMATED BLOOD LOSS:  1100 mL.   DRAINS:  Foley with 200 mL of clear yellow urine.   COMPLICATIONS:  None immediate.   SPECIMENS:  Placenta to pathology.   DESCRIPTION OF PROCEDURE:  The patient was taken to the operating room  and after obtaining adequate epidural anesthesia was prepped and draped  in the usual sterile manner in the supine position with left lateral  uterine displacement.  After ensuring adequate anesthesia, a  Pfannenstiel skin incision was made using the scalpel.  The incision was  carried down through the subcutaneous tissues using the scalpel.  The  rectus fascia was nicked in the midline, and the incision was  extended  laterally in each direction using the Mayo scissors.  The fascia was  then elevated off the rectus muscles with Kochers and dissected bluntly  and sharply.  The rectus muscles were entered bluntly with a hemostat  and then separated bluntly.  The parietal peritoneum was identified and  entered bluntly with hemostat and separated.  The bladder was  identified.  An Alexis wound retractor was then placed.  Bladder was  reidentified and found to be below the area of anticipated incision.  A  low transverse uterine incision was made using the scalpel.  This was  extended laterally and superior using blunt dissections.  The membranes  were ruptured with Allis clamp with clear amniotic fluid.  Hand was  placed within the uterine cavity and used to elevated and flex the head  of the infant which was delivered without difficulty.  The body of the  infant was then delivered without difficulty.  The infant was bulb-  suctioned after delivery.  Cord was doubly clamped and cut and the  infant handed to  the nursery team in attendance.  Placenta was delivered  and appeared intact.  The uterus was wiped free of any traces of  membranes using dry laparotomy sponges.  Portions of the membranes were  removed with ring clamps.  The edges of the uterine incision were  grasped with ring clamps.  An area of bleeding on the inferior portion  of the uterine incision was also clamped with ring clamp.  The uterine  incision was closed with a first layer of running locked Vicryl.  The  uterine incision appeared hemostatic at that point.  Attention was then  turned to the patient's left tube.  Babcock clamps were placed on the  tube, and the fimbria of the tube was identified.  A Filshie clamp was  placed between 2 Babcocks for tubal occlusion.  Attention was then  turned to the right tube where 2 Babcocks were placed.  The fimbria was  again identified.  Then a Filshie clip was placed between the 2  Babcocks  for tubal occlusion.  The Babcocks were released, and the tube was  placed back into the abdominal cavity.  Attention was then turned back  to the uterine incision where a small amount of bleeding was noted at  the left lateral edge of the uterine incision.  A partial imbricating  layer with 0 Vicryl was performed on that portion of the uterine  incision.  The incision was inspected once again, and good hemostasis  was noted.  The Alexis wound retractor was then removed and the uterus  allowed to fall back into the peritoneal cavity.  The rectus muscles  were inspected and noted to be hemostatic.  The rectus fascia was  approximated with 1 suture of 0 Vicryl.  Small areas of bleeding of  subcutaneous tissue were controlled using Bovie cautery.  The skin was  reapproximated with stainless steel skin staples.  Sponge and needle  counts were correct x2.  The patient tolerated the procedure well and  went to recovery in stable condition.      Karlton Lemon, MD  Electronically Signed     ______________________________  Shelbie Proctor. Shawnie Pons, M.D.    NS/MEDQ  D:  12/02/2006  T:  12/02/2006  Job:  [id number]

## 2010-07-18 NOTE — Group Therapy Note (Signed)
NAME:  [name], [name]               ACCOUNT NO.:  [id number]   MEDICAL RECORD NO.:  [id number]          PATIENT TYPE:  POB   LOCATION:  [facility]                   FACILITY:  [facility]   PHYSICIAN:  Elsie Lincoln, MD      DATE OF BIRTH:  Apr 04, 1970   DATE OF SERVICE:                                    CLINIC NOTE   Patient is a 40 year old G5, para 2-0-3-2, LMP four months ago for a  physical.  Patient new to the practice.  She has PCO and type II diabetes.  She most likely wants to become pregnant again.  She recently obtained  insurance and needs treatment for her diabetes, PCO, and amenorrhea.   PHYSICAL HISTORY:  Significant for 2 first-trimester SABs and an 18-week  lost and that was when her diabetes was diagnosed.  During that pregnancy  she also had an abnormal triple screen, then a subsequent ultrasound was  done and the baby was found to not have a heartbeat.  It could be due to  maybe a Down's but also maybe from hyperglycemia.  Since then the patient  has had abnormal cycles and the length of amenorrhea has become progressing  longer, the last has been four months.  Patient also has had a 20-pound  weight loss and has feelings of anxiety.  So I believe TSH is warranted.  She takes her sugars several times a day and her fastings range from in the  100s-130s.  She is not under the care of medicine doctor for this, however.  She occasionally takes sliding scale insulin for her diabetes when she has a  diet indiscretion.  Patient would most likely become pregnant again but is  not ovulating regularly.  Patient needs to menstruate more frequently and  would probably benefit from Glucophage other than OCPs in order to become  pregnant and ovulate.   PAST MEDICAL HISTORY:  Type II diabetes.   PAST SURGICAL HISTORY:  Denies.   PAST OB HISTORY:  NSVD x2 and SAB x2, D & C x1.   ALLERGIES:  None.   MEDICATIONS:  None.   FAMILY HISTORY:  Step sister died of a recent overdose.   Her dad died of  acute MI four years ago.  Mom is alive and healthy.  Brother is healthy.   PHYSICAL EXAMINATION:  GENERAL:  Well-nourished, well-developed, in no  apparent distress.  VITAL SIGNS:  Pulse 99, blood pressure 120/86, weight 143, height 5 feet 7.  HEENT:  Normocephalic, atraumatic.  Thyroid with no masses, no thyromegaly.  LUNGS:  Clear to auscultation bilaterally.  HEART:  Regular rate and rhythm.  BREASTS:  No masses, nontender, no lymphadenopathy.  ABDOMEN:  Soft, nontender, nondistended, no hernia.  GENITALIA:  Tanner V, pink, normal rugae.  Urethra nontender.  Bladder no  prolapse.  Cervix closed, nontender.  Uterus is anteverted, nontender.  Ovaries plump, nontender.  Perineum intact.  EXTREMITIES:  Nontender.   ASSESSMENT/PLAN:  A 40 year old female with PCO.  1.  Pap smear.  2.  TSH.  3.  Fasting cholesterol panel.  4.  Metformin 500 q. day for a  week, then b.i.d.  5.  Patient needs endometrial biopsy.  6.  Return for that with Motrin preop.           ______________________________  Elsie Lincoln, MD     KL/MEDQ  D:  09/22/2005  T:  09/23/2005  Job:  [id number]

## 2010-07-18 NOTE — Group Therapy Note (Signed)
NAME:  [name], [name]               ACCOUNT NO.:  [id number]   MEDICAL RECORD NO.:  [id number]          PATIENT TYPE:  POB   LOCATION:  [facility]                   [facility]   PHYSICIAN:  Elsie Lincoln, MD      DATE OF BIRTH:  [birth year]   DATE OF SERVICE:  10/08/2005                                    CLINIC NOTE   REASON FOR VISIT:  The patient returns for endometrial biopsy.  UBT  negative.  Consent signed.  Sterile procedure followed.  Uterus sounded to 8  cm, two passes taken.  See prior clinic note for details for  _____________biopsy.   ASSESSMENT AND PLAN:  The patient is to come back in two weeks for results  of initial biopsy.  The patient also had cholesterol done today.  Her  fasting blood sugars are still in the 130s.  We will increase metformin in  the evening to 1000 mg.  Still continue metformin in the morning at 500 mg.  Also, her TSH was normal and her Pap smear was normal.  If unable to control  her diabetes with metformin, we will add glyburide and at that point refer  her then to a family practice doctor to manage her diabetes at this point.           ______________________________  Elsie Lincoln, MD     KL/MEDQ  D:  10/08/2005  T:  10/08/2005  Job:  [id number]

## 2010-07-18 NOTE — Assessment & Plan Note (Signed)
[facility]                             [facility] OFFICE NOTE   NAME:WHETZELKehinde, Totzke                      MRN:          [id number]  DATE:12/21/2005                            DOB:          March 11, 1970    NEW PATIENT VISIT   CHIEF COMPLAINT:  A 40 year old white female here to establish a new doctor.   HISTORY OF PRESENT ILLNESS:  Mrs. Paz is a 40 year old female, who does  not have a primary care doctor.  She had been previously treated by her  gynecologist, who diagnosed her with diabetes 2 years ago during pregnancy.  She has now begun seeing a new gynecologist, Dr. Penne Lash.  Dr. Penne Lash  recently diagnosed her with polycystic ovarian syndrome and is referring her  for primary care.  Mrs. Stukes states that she initially found out she had  diabetes during this pregnancy 2 years ago.  She initially lost the first  baby early in the pregnancy and then got pregnant 2 months later.  During  the pregnancy, she used insulin for tight control but then following this  she eventually transitioned over to metformin.  She states she has a return  to her periods to be normal frequency on metformin.  She checks her blood  sugars 5-10 times per day.  Her fasting blood sugars range between 105 to  135.  Her 2-hour postprandial range from 135-235.  She only occasionally has  numbers above 200 which seem to be associated with poor control when she is  around her period.   REVIEW OF SYSTEMS:  No headache.  No dizziness.  No syncope.  Wears  contacts.  No hearing problems.  No dyspnea.  No chest pain.  Occasional  palpitations occurring several times per week.  She also feels like she  occasionally skips a beat.  She has had a negative thyroid panel and  negative cholesterol panel done by the gynecologist in regards to this.  She  has never had an EKG.  No nausea, vomiting, diarrhea, constipation or rectal  bleeding.   PAST MEDICAL HISTORY:   Diabetes.   HOSPITALIZATIONS, SURGERIES, PROCEDURES:  1. G-5, P-2, NSVD.  2. In 2007 endometrial biopsy, negative.  3. Pap smear July 2007 negative.   ALLERGIES:  NONE.   MEDICATIONS:  1. Metformin 500 mg p.o. t.i.d.  2. Prenatal vitamins.   FAMILY HISTORY:  Father deceased at age 52 with MI.  Mother alive at age 78  with unknown arrhythmia and defibrillation as well as with anxiety.  She has  1 brother, who was healthy.  There is no family history of diabetes.  She  has a paternal grandmother  with breast cancer diagnosed and causing death  in the late 33s.  She has a paternal grandfather with lymphoma.   SOCIAL HISTORY:  She does not smoke but was a former smoking about 5-10 pack  year history.  Light alcohol use with 2 drinks per month mainly wine. She  denies any other drug use.  She works as an Scientist, water quality.  She has been  married for 10  years.  She denies domestic abuse.  She has two children age  51 and 65, who are healthy.  She walks about 2 times per week.  She eats 3  meals a day trying to get about 2 carbs for breakfast, 3-4 carbs for lunch  and dinner.  She eats fruits and vegetables and drinks water frequently.   PHYSICAL EXAMINATION:  VITAL SIGNS:  Height 65-1/2 inches.  Weight is 138  making BMI 23 to 24.  Blood pressure 122/70.  Pulse 88.  Temperature 98.1.  GENERAL:  Healthy-appearing female in no apparent distress.  HEENT:  PERRLA.  Extraocular muscles intact.  No papilledema.  Nares clear.  Tympanic membranes clear.  Oropharynx clear.  NECK:  No thyromegaly.  No lymphadenopathy, supraclavicular or cervical.  CARDIOVASCULAR:  Regular rate and rhythm.  No murmurs, rubs or gallops.  No  peripheral edema, 2+ peripheral pulses.  PULMONARY:  Clear to auscultation bilaterally.  No wheezes, rales or  rhonchi.  ABDOMEN:  Soft, nontender.  Normal active bowel sounds.  No  hepatosplenomegaly.  MUSCULOSKELETAL:  Strength 5/5 in upper and lower extremities.  SKIN:   Occasional freckle.  No worrisome lesions.  NEURO:  Alert and oriented x3.  Cranial nerves II-XII grossly intact.  Reflexes 2+.   ASSESSMENT AND PLAN:  1. Diabetes:  Given her lack of family history and her early onset, as      well as limited risk factors, I would like to determine if this is      diabetes mellitus type 2 or late onset type 1.  I will obtain a C-      peptide.  We discussed diabetes guidelines as well as nutrition.  We      will have her return for a hemoglobin A1c along with obtaining the C-      peptide.  She will return to see me every 3 months.  I will obtain labs      from her previous doctors.  For now, we will continue her on metformin      500 mg p.o. t.i.d.  She was instructed that she could reduce the amount      of times she does fingersticks given that she is no longer on insulin.      She was given goals for her blood sugars throughout the day.  I will      also obtain a urine micro-albumin.  She has had yearly eye exams with      no diabetic retinopathy.  She was instructed to inspect her feet daily.  2. Palpitations, chronic:  I will obtain an EKG today.  If I am unable to      isolate anything and her symptoms continue to bother her, we can      consider Holter monitor.  3. Prevention:  She is up-to-date with Pap smears.  Given her      grandmother's history of breast cancer we can consider doing early      mammogram but she does also see Dr. Penne Lash and we can obtain her      opinion on an early mammogram.  She will continue to take prenatal      vitamins daily.  She is unsure when her last tetanus was and I will      review her old records for this.  She was given a flu shot today.      Kerby Nora, MD   AB/MedQ  DD:  12/21/2005  DT:  12/22/2005  Job #:  [id number]

## 2010-07-18 NOTE — Discharge Summary (Signed)
[name], [name]                ACCOUNT NO.:  [id number]   MEDICAL RECORD NO.:  [id number]          PATIENT TYPE:  INP   LOCATION:  9302                          [facility]ACILITY:  [facility]   PHYSICIAN:  Ginger Carne, MD  DATE OF BIRTH:  [birth year]   DATE OF ADMISSION:  01/30/2004  DATE OF DISCHARGE:                                 DISCHARGE SUMMARY   FINAL DIAGNOSIS:  First trimester uncontrolled diabetes mellitus type 1.   HOSPITAL COURSE:  This patient is a 40 year old gravida 4 para 2-0-1-2  Caucasian female, 6-and-a-half weeks gestation by dates and transvaginal  ultrasound demonstrating a viable intrauterine pregnancy who was admitted  because of uncontrolled type 1 diabetes mellitus with blood sugars anywhere  from 200-500 range postprandial and on random check.  The patient was  started on intensive teaching regimen with the care management involved.  The patient underwent nutritional counseling for a 2200 calorie ADA diet as  well as guidelines and training for the use of insulin.  The patient's  admitting hemoglobin A1c is 9.7, creatinine 0.6.  Her blood sugar on her  first day of admission following insulin regimen fasting was 136.  Most  values were between 140 and 200.  A 24-hour creatinine and protein urine  check revealed suitable sample with a total 24-hour protein of 165.  Other  laboratory parameters were within normal limits.   On discharge the patient will [facility]  However, she prefers to go to the one in [place] due to proximity.  The  patient will deliver her at [facility], however, for teaching  purposes; it is more convenient.  She is going to purchase an over-the-  Database administrator until [facility] hooks her up with a regular  Glucometer.   The patient's discharge insulin levels are as follows:  1.  Before breakfast 6 units of Humulin N, 10 units of regular.  2.  Before lunch 4 units of regular.  3.  Before  dinner 4 units of regular.  4.  At bedtime 6 units of Humulin N.   The patient feels comfortable providing her own injections and all  medications and materials called into her pharmacy of choice.  The patient  was advised to contact me at any time regarding concerns of blood sugar  levels.  She was also advised that if she starts developing symptoms of a  urinary infection, symptoms of hypoglycemia, and/or other concerns to call  immediately.  Either way, she will call every-other day throughout the next  several months to assure that her blood sugars are within normal limits.  At  this point, the patient is not being kept on tight control due to not having  an active exercise program at home and she will be adjusted and titrated as  the days go on to a more tighter control level.  As far as her Glucometer  situation, it is hopeful that within 2 weeks she will transition from an  over-the-counter pharmacy Glucometer to one provided by the large companies  that manufacture these.  On the  date of her discharge, the patient's fasting  blood sugar was 139; 3 a.m. - 150; at 9 p.m. on January 31, 2004 - 170; and 5 p.m. on January 31, 2004 - 204.  These numbers will  improve over the next 1-2 weeks as more aggressive management is conducted  and the patient is ambulating.  She will be seen in the office in 1 week and  will call every-other day.     Stev   SHB/MEDQ  D:  02/01/2004  T:  02/01/2004  Job:  [id number]

## 2010-07-18 NOTE — H&P (Signed)
NAMEALYSSA, [name]                ACCOUNT NO.:  [id number]   MEDICAL RECORD NO.:  [id number]          PATIENT TYPE:  INP   LOCATION:  9302                          [facility]   PHYSICIAN:  Ginger Carne, MD  DATE OF BIRTH:  19-Feb-1971   DATE OF ADMISSION:  01/30/2004  DATE OF DISCHARGE:                                HISTORY & PHYSICAL   ADMISSION DIAGNOSIS:  Uncontrolled type 1 diabetes mellitus first trimester  of pregnancy.   HISTORY OF PRESENT ILLNESS:  This patient is a 40 year old gravida 4, para 2-  0-1-2 Caucasian female 6-1/[redacted] weeks gestation by dates and by a transvaginal  ultrasound admitted with elevated postprandial and fasting blood sugars.  The patient had a glucose tolerance test on January 16, 2004 with a 75 gram  load.  At that time, her fasting sugar level was 258 and 1-hour was 463  after caloric intake.  The patient has no specific symptomatology for  diabetes and also has no personal family history of same.  She denies  specifically any ocular neurological complaints.   OBSTETRICAL/GYNECOLOGICAL HISTORY:  The patient had in 1996 a full-term  vaginal delivery, same in 2002.  At that time, her infant weighed 10 pounds  at birth; however, a glucose screen was normal.  In September of 2005, she  had a 10-week incomplete abortion followed by dilation and curettage at  [facility].   ALLERGIES:  None.   CURRENT MEDICATIONS:  Prenatal vitamins.   SOCIAL HISTORY:  The patient is a nonsmoker and does not abuse alcohol or  illicit drugs.   SURGICAL HISTORY:  Negative.   REVIEW OF SYSTEMS:  Negative.   PHYSICAL EXAMINATION:  GENERAL:  A pleasant female, in no acute distress.  VITAL SIGNS:  Height 5 feet 7 inches, weight 159 pounds, 72 kg, blood  pressure 126/70.  HEENT:  Grossly normal.  No evidence for fundal abnormalities.  BREASTS:  Without mass, discharge, thickening, or tenderness.  CHEST:  Clear to percussion and auscultation.  CARDIOVASCULAR:  Without murmurs or enlargement.  Regular rate and rhythm.  VASCULAR, EXTREMITY, LYMPHATIC, NEUROLOGICAL, MUSCULOSKELETAL:  Systems  normal.  ABDOMEN:  Soft without gross hepatosplenomegaly.  PELVIC:  External genitalia, vulva, and vagina normal.  Cervix smooth  without erosions or lesions.  Normal Pap smear.  Uterus about 6 weeks in  size and globular.  Both adnexa palpable and found to be normal.   OBSTETRICAL EVALUATION:  Last menstrual period December 15, 2003, gestational  age 60+ weeks.  Transvaginal ultrasound performed on January 29, 2004  reveals 6-3/[redacted] weeks gestation compatible with an [facility] on both LMP and  transvaginal ultrasound of September 21, 2004.   IMPRESSION:  Uncontrolled type 1 diabetes mellitus first trimester of  pregnancy.   PLAN:  The patient is admitted for intensive management including  subcutaneous insulin management utilizing Humulin N and R.  The patient will  be placed on a 2200-calorie ADA diet based on weight and will have 3 meals  and 3 snacks daily.  [facility] including teaching for Glucometry as  well as for insulin  management will be also ordered.  Routine blood work and  24-hour urine to evaluate protein will also be performed.  The patient is  also advised as to the nature of her condition specifically both in terms of  diabetes proper and its relation to pregnancy.  The patient at this time  will be in the hospital for 2-4 days depending on her response to Humulin  and will at that point be discharged with daily management.     Stev   SHB/MEDQ  D:  01/30/2004  T:  01/30/2004  Job:  [id number]

## 2010-07-18 NOTE — Op Note (Signed)
[name], [name]               ACCOUNT NO.:  [id number]   MEDICAL RECORD NO.:  [id number]          PATIENT TYPE:  AMB   LOCATION:  [facility]                           [facility]   PHYSICIAN:  Ginger Carne, MD  DATE OF BIRTH:  06/18/1970   DATE OF PROCEDURE:  04/25/2004  DATE OF DISCHARGE:                                 OPERATIVE REPORT   PREOPERATIVE DIAGNOSIS:  Eighteen-week fetal death in utero.   POSTOPERATIVE DIAGNOSIS:  Eighteen-week fetal death in utero.   PROCEDURE:  Aspiration and dilatation and extraction.   SURGEON:  Ginger Carne, MD   ASSISTANT:  None.   COMPLICATIONS:  None immediate.   ANESTHESIA:  MAC and local.   SPECIMENS:  Fetal tissue.   ESTIMATED BLOOD LOSS:  Minimal.   OPERATIVE FINDINGS:  An 78 week-size uterus compatible with fetal death in  utero at the same gestational age.  Products of conception were identified,  sent for chromosome analysis and karyotype.  The uterus at the end of the  procedure revealed no evidence of any tissue remaining inside the uterus,  and there was no evidence for perforation.   OPERATIVE PROCEDURE:  The patient prepped and draped in the usual fashion  and placed in the lithotomy position, Betadine solution used for antiseptic.  The patient was catheterized prior to the procedure.  After adequate MAC  anesthesia and local, a tenaculum placed on the anterior lip of the cervix,  10 mL of Marcaine with epinephrine was injected circumferentially around the  cervix.  A dilatation of the cervix to accommodate a #16 suction curette was  then followed by suction and sharp curettage.  Specimens were carefully  retrieved, and at no time was there any indication for perforation of the  uterus.  At the end of the procedure, the uterus was again checked using a  curette and no evidence of tissue noted.  Minimal bleeding noted at the end  of the procedure.  The patient returned to the postanesthesia recovery room  in  excellent condition.      SHB/MEDQ  D:  04/25/2004  T:  04/25/2004  Job:  [id number]

## 2010-07-18 NOTE — Group Therapy Note (Signed)
NAME:  [name], [name]               ACCOUNT NO.:  [id number]   MEDICAL RECORD NO.:  [id number]          PATIENT TYPE:  POB   LOCATION:  [facility]                   FACILITY:  [facility]   PHYSICIAN:  Elsie Lincoln, MD      DATE OF BIRTH:  1970/12/26   DATE OF SERVICE:  10/22/2005                                    CLINIC NOTE   The patient is a 40 year old female with amenorrhea, who returns for  results.  The patient's endometrial biopsy shows proliferative endometrium,  no hyperplasia or malignancy.  The patient needs regular withdrawal bleeds.  She chooses Provera withdrawal bleeds, verus oral contraceptive.  We will  cycle her on Provera 14 days, every other month.  If she starts having  irregular bleeding in between then, we will change her to oral  contraceptives.  She is currently on Metformin 500 mg one and a half pills  at night and 500 mg in the morning.  As there was __________ sugar, I  suggested she get a primary care doctor to help her diabetes.  She is going  to go to [facility], either Dr. Joselyn Glassman or Dr. Lois Huxley.   Her cholesterol lipid panel was normal.  Her total cholesterol was 199, her  LDL was 125 with her HDL being 58.  Her ratio was good.   The patient's Pap smear is also normal.   She is up to date on all of her health maintenance, as far as GYN, so she  can come back to [place] in a year.  If she has any other GYN problems, she can  come back to [place] sooner.           ______________________________  Elsie Lincoln, MD     KL/MEDQ  D:  10/22/2005  T:  10/22/2005  Job:  [id number]

## 2010-07-18 NOTE — Op Note (Signed)
[name], [name]                            ACCOUNT NO.:  [id number]   MEDICAL RECORD NO.:  [id number]                   PATIENT TYPE:  AMB   LOCATION:  [facility]                                  [facility]   PHYSICIAN:  Ginger Carne, MD               DATE OF BIRTH:  [birth year]   DATE OF PROCEDURE:  11/20/2003  DATE OF DISCHARGE:                                 OPERATIVE REPORT   PREOPERATIVE DIAGNOSES:  First trimester incomplete abortion.   POSTOPERATIVE DIAGNOSES:  First trimester incomplete abortion.   PROCEDURE:  Aspiration, dilatation and curettage.   SURGEON:  Ginger Carne, MD   ASSISTANT:  None.   COMPLICATIONS:  None immediate.   ANESTHESIA:  General.   ESTIMATED BLOOD LOSS:  Negligible.   SPECIMENS:  Products of conception.   FINDINGS:  The uterus was about nine weeks in size with an open cervix and  products of conception noted on curettage. Both adnexa palpable and found to  be normal. The uterine cavity was appropriately enlarged but smooth.   DESCRIPTION OF PROCEDURE:  The patient was prepped and draped in the usual  fashion and placed in the lithotomy position, Betadine solution used for  antiseptic.  Tenaculum placed on the anterior lip of the cervix, dilatation  to accommodate a #8 suction curette. The curette was followed by sharp and  then suction to follow.  Specimen to pathology. No active bleeding noted at  the end of the procedure. The patient returned to the post anesthesia  recovery room in excellent condition. At the time of this dictation, Rh was  pending.      SHB/MEDQ  D:  11/20/2003  T:  11/20/2003  Job:  [id number]

## 2010-07-18 NOTE — H&P (Signed)
NAME:  [name], [name] NO.:  [id number]   MEDICAL RECORD NO.:  [id number]                   PATIENT TYPE:  AMB   LOCATION:  [facility]                                  [facility]   PHYSICIAN:  Ginger Carne, MD               DATE OF BIRTH:  [birth year]   DATE OF ADMISSION:  DATE OF DISCHARGE:                                HISTORY & PHYSICAL   DATE OF SURGERY:  November 20, 2003   FINAL DIAGNOSIS:  Incomplete first trimester abortion.   HISTORY OF PRESENT ILLNESS:  This patient is a 40 year old gravida 3 para 2-  0-0-2 Caucasian female with a 9-week incomplete abortion based on  transvaginal ultrasound today on November 19, 2003.  The patient has  complained of 2 days of spotting with the resultant aforementioned findings.   The patient's last menstrual period was August 06, 2003 but this was based on  irregular cycles and had not used contraception.   OBSTETRICAL AND GYNECOLOGICAL HISTORY:  The patient has had two vaginal  deliveries - in 1996 and 2002.   ALLERGIES:  None.   CURRENT MEDICATIONS:  Prenatal vitamins.   MEDICAL HISTORY:  Negative.   SOCIAL HISTORY:  Negative.   REVIEW OF SYSTEMS:  Negative for smoking, drug abuse, or alcohol abuse.  Review of systems is negative.   FAMILY HISTORY:  Negative for first degree relatives having breast, colon,  ovarian, or uterine carcinoma.   PHYSICAL EXAMINATION:  GENERAL:  A pleasant female in no acute distress.  VITAL SIGNS:  Blood pressure is 114/62, weight 159 pounds, height is 5 feet  7 inches.  HEENT:  Grossly normal.  BREAST:  Without masses, discharge, thickenings, or tenderness.  CHEST:  Clear to percussion and auscultation.  CARDIOVASCULAR:  Without murmurs or enlargements, regular rate and rhythm.  EXTREMITIES, LYMPHATIC, SKIN, NEUROLOGIC, MUSCULOSKELETAL:  Within normal  limits.  ABDOMEN:  Supple without gross hepatosplenomegaly.  PELVIC:  Normal Pap smear.  External genitalia,  vulva, and vagina normal.  Cervix smooth without erosions or lesions.  Uterus is about 8 to 7 weeks in  size with blood at the os.  Both adnexa are palpable, found to be normal.   IMPRESSION:  First trimester incomplete abortion.   PLAN:  Aspiration dilatation and curettage.  The nature of said procedure  discussed in detail with said patient.      SHB/MEDQ  D:  11/19/2003  T:  11/19/2003  Job:  [id number]

## 2010-08-14 ENCOUNTER — Other Ambulatory Visit: Payer: Self-pay | Admitting: Family Medicine

## 2010-10-07 ENCOUNTER — Other Ambulatory Visit: Payer: Self-pay | Admitting: Family Medicine

## 2010-10-07 NOTE — Telephone Encounter (Signed)
Patient not seen in over 1 year for physical 

## 2010-10-07 NOTE — Telephone Encounter (Signed)
Ok to refill 1 month supply, schedule CPX with Dr. Ermalene Searing

## 2010-10-08 NOTE — Telephone Encounter (Signed)
Patient keeps cell phone turned off and That's the only number we had I advised her to call and schedule appt b/c we can only refill medication for one month until then

## 2010-11-04 ENCOUNTER — Encounter: Payer: Self-pay | Admitting: Family Medicine

## 2010-11-05 ENCOUNTER — Ambulatory Visit (INDEPENDENT_AMBULATORY_CARE_PROVIDER_SITE_OTHER): Payer: BC Managed Care – PPO | Admitting: Family Medicine

## 2010-11-05 ENCOUNTER — Encounter: Payer: Self-pay | Admitting: Family Medicine

## 2010-11-05 DIAGNOSIS — D649 Anemia, unspecified: Secondary | ICD-10-CM

## 2010-11-05 DIAGNOSIS — E785 Hyperlipidemia, unspecified: Secondary | ICD-10-CM

## 2010-11-05 DIAGNOSIS — E119 Type 2 diabetes mellitus without complications: Secondary | ICD-10-CM

## 2010-11-05 LAB — COMPREHENSIVE METABOLIC PANEL
ALT: 13 U/L (ref 0–35)
AST: 12 U/L (ref 0–37)
Albumin: 4 g/dL (ref 3.5–5.2)
CO2: 28 mEq/L (ref 19–32)
Calcium: 8.6 mg/dL (ref 8.4–10.5)
Chloride: 105 mEq/L (ref 96–112)
GFR: 146.8 mL/min (ref >60.00)
Potassium: 3.6 mEq/L (ref 3.5–5.1)
Sodium: 141 mEq/L (ref 135–145)
Total Protein: 7 g/dL (ref 6.0–8.3)

## 2010-11-05 LAB — LIPID PANEL
HDL: 56.2 mg/dL (ref >39.00)
Total CHOL/HDL Ratio: 3

## 2010-11-05 LAB — MICROALBUMIN / CREATININE URINE RATIO
Creatinine,U: 314.6 mg/dL
Microalb Creat Ratio: 0.3 mg/g (ref 0.0–30.0)
Microalb, Ur: 1 mg/dL (ref 0.0–1.9)

## 2010-11-05 LAB — HM DIABETES FOOT EXAM

## 2010-11-05 LAB — HEMOGLOBIN A1C: Hgb A1c MFr Bld: 6.3 % (ref 4.6–6.5)

## 2010-11-05 MED ORDER — PRAVASTATIN SODIUM 20 MG PO TABS: 20.0000 mg | ORAL_TABLET | Freq: Every day | ORAL | Status: DC

## 2010-11-05 MED ORDER — METFORMIN HCL ER 750 MG PO TB24: ORAL_TABLET | ORAL | Status: DC

## 2010-11-05 MED ORDER — INSULIN PEN NEEDLE 29G X 12.7MM MISC: Status: DC

## 2010-11-05 MED ORDER — INSULIN GLARGINE 100 UNIT/ML ~~LOC~~ SOLN: 28.0000 [IU] | Freq: Every day | SUBCUTANEOUS | Status: DC

## 2010-11-05 NOTE — Assessment & Plan Note (Signed)
Due for re-eval. 

## 2010-11-05 NOTE — Progress Notes (Signed)
  Subjective:    Patient ID: [name], female    DOB: 1970/03/16, 40 y.o.   MRN: [id number]  HPI   Diabetes: On 28 lantus and  metfomin daily .Marland Kitchen overdur for reeval Lab Results  Component Value Date   HGBA1C 7.3* 11/01/2009   Using medications without difficulties:Yes Hypoglycemic episodes:NONE Hyperglycemic episodes: occasionally depending on diet Feet problems:None Blood Sugars averaging: FBS 85-140, average 120 eye exam within last year: Yes  Elevated Cholesterol: On pravachol 20 mg daily. Using medications without problems: None Muscle aches: None Other complaints:    Review of Systems  Constitutional: Negative for fever and fatigue.  HENT: Negative for ear pain.   Eyes: Negative for pain.  Respiratory: Negative for chest tightness and shortness of breath.   Cardiovascular: Negative for chest pain, palpitations and leg swelling.  Gastrointestinal: Negative for abdominal pain.  Genitourinary: Negative for dysuria.       Objective:   Physical Exam  Constitutional: Vital signs are normal. She appears well-developed and well-nourished. She is cooperative.  Non-toxic appearance. She does not appear ill. No distress.  HENT:  Head: Normocephalic.  Right Ear: Hearing, tympanic membrane, external ear and ear canal normal. Tympanic membrane is not erythematous, not retracted and not bulging.  Left Ear: Hearing, tympanic membrane, external ear and ear canal normal. Tympanic membrane is not erythematous, not retracted and not bulging.  Nose: No mucosal edema or rhinorrhea. Right sinus exhibits no maxillary sinus tenderness and no frontal sinus tenderness. Left sinus exhibits no maxillary sinus tenderness and no frontal sinus tenderness.  Mouth/Throat: Uvula is midline, oropharynx is clear and moist and mucous membranes are normal.  Eyes: Conjunctivae, EOM and lids are normal. Pupils are equal, round, and reactive to light. No foreign bodies found.  Neck: Trachea normal and  normal range of motion. Neck supple. Carotid bruit is not present. No mass and no thyromegaly present.  Cardiovascular: Normal rate, regular rhythm, S1 normal, S2 normal, normal heart sounds, intact distal pulses and normal pulses.  Exam reveals no gallop and no friction rub.   No murmur heard. Pulmonary/Chest: Effort normal and breath sounds normal. Not tachypneic. No respiratory distress. She has no decreased breath sounds. She has no wheezes. She has no rhonchi. She has no rales.  Abdominal: Soft. Normal appearance and bowel sounds are normal. There is no tenderness.  Neurological: She is alert.  Skin: Skin is warm, dry and intact. No rash noted.  Psychiatric: Her speech is normal and behavior is normal. Judgment and thought content normal. Her mood appears not anxious. Cognition and memory are normal. She does not exhibit a depressed mood.   Diabetic foot exam: Normal inspection No skin breakdown No calluses  Normal DP pulses Normal sensation to light touch and monofilament Nails normal        Assessment & Plan:

## 2010-11-05 NOTE — Assessment & Plan Note (Signed)
Due for re-eval. Encouraged exercise, weight loss, healthy eating habits.  

## 2010-11-05 NOTE — Patient Instructions (Signed)
We will call you with labs results.
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
## 2010-11-07 ENCOUNTER — Other Ambulatory Visit: Payer: Self-pay | Admitting: Family Medicine

## 2010-11-07 ENCOUNTER — Encounter: Payer: Self-pay | Admitting: *Deleted

## 2010-12-11 LAB — CBC
HCT: 32 — ABNORMAL LOW
Hemoglobin: 7.6 — CL
MCHC: 35.3
MCV: 92.7
Platelets: 149 — ABNORMAL LOW
Platelets: 170
RBC: 3.45 — ABNORMAL LOW
RDW: 14.2 — ABNORMAL HIGH
WBC: 10.8 — ABNORMAL HIGH

## 2010-12-11 LAB — BASIC METABOLIC PANEL
BUN: 8
Chloride: 106
Creatinine, Ser: 0.54
GFR calc Af Amer: >60
GFR calc non Af Amer: >60
Potassium: 3.5

## 2010-12-11 LAB — RPR: RPR Ser Ql: NONREACTIVE

## 2010-12-11 LAB — DIFFERENTIAL
Lymphocytes Relative: 15
Lymphs Abs: 1.7
Monocytes Relative: 5
Neutrophils Relative %: 79 — ABNORMAL HIGH

## 2011-02-09 ENCOUNTER — Telehealth: Payer: Self-pay | Admitting: Family Medicine

## 2011-02-09 DIAGNOSIS — E119 Type 2 diabetes mellitus without complications: Secondary | ICD-10-CM

## 2011-02-09 DIAGNOSIS — D649 Anemia, unspecified: Secondary | ICD-10-CM

## 2011-02-09 DIAGNOSIS — E785 Hyperlipidemia, unspecified: Secondary | ICD-10-CM

## 2011-02-09 NOTE — Telephone Encounter (Signed)
Message copied by Excell Seltzer on Mon Feb 09, 2011  9:18 AM ------      Message from: Alvina Chou      Created: Thu Feb 05, 2011  4:45 PM      Regarding: orders for 12-14       Patient is scheduled for CPX labs, please order future labs, Thanks , Camelia Eng

## 2011-02-09 NOTE — Telephone Encounter (Signed)
Message copied by Amour Cutrone E on Mon Feb 09, 2011  9:18 AM ------      Message from: WALSH, TERRI J      Created: Thu Feb 05, 2011  4:45 PM      Regarding: orders for 12-14       Patient is scheduled for CPX labs, please order future labs, Thanks , Terri       

## 2011-02-13 ENCOUNTER — Other Ambulatory Visit (INDEPENDENT_AMBULATORY_CARE_PROVIDER_SITE_OTHER): Payer: BC Managed Care – PPO

## 2011-02-13 DIAGNOSIS — E785 Hyperlipidemia, unspecified: Secondary | ICD-10-CM

## 2011-02-13 DIAGNOSIS — E119 Type 2 diabetes mellitus without complications: Secondary | ICD-10-CM

## 2011-02-13 DIAGNOSIS — D649 Anemia, unspecified: Secondary | ICD-10-CM

## 2011-02-13 LAB — LIPID PANEL
Cholesterol: 192 mg/dL (ref 0–200)
LDL Cholesterol: 114 mg/dL — ABNORMAL HIGH (ref 0–99)
Total CHOL/HDL Ratio: 3
Triglycerides: 87 mg/dL (ref 0.0–149.0)
VLDL: 17.4 mg/dL (ref 0.0–40.0)

## 2011-02-13 LAB — CBC WITH DIFFERENTIAL/PLATELET
Basophils Absolute: 0 10*3/uL (ref 0.0–0.1)
Eosinophils Absolute: 0.2 10*3/uL (ref 0.0–0.7)
Lymphocytes Relative: 30.2 % (ref 12.0–46.0)
MCHC: 34.2 g/dL (ref 30.0–36.0)
Neutrophils Relative %: 59.5 % (ref 43.0–77.0)
Platelets: 254 10*3/uL (ref 150.0–400.0)
RDW: 13.4 % (ref 11.5–14.6)

## 2011-02-13 LAB — COMPREHENSIVE METABOLIC PANEL
AST: 15 U/L (ref 0–37)
Alkaline Phosphatase: 47 U/L (ref 39–117)
BUN: 18 mg/dL (ref 6–23)
Glucose, Bld: 111 mg/dL — ABNORMAL HIGH (ref 70–99)
Sodium: 141 mEq/L (ref 135–145)
Total Bilirubin: 0.6 mg/dL (ref 0.3–1.2)

## 2011-02-20 ENCOUNTER — Encounter: Payer: BC Managed Care – PPO | Admitting: Family Medicine

## 2011-03-11 ENCOUNTER — Ambulatory Visit (INDEPENDENT_AMBULATORY_CARE_PROVIDER_SITE_OTHER): Payer: Self-pay | Admitting: Family Medicine

## 2011-03-11 ENCOUNTER — Encounter: Payer: Self-pay | Admitting: Family Medicine

## 2011-03-11 VITALS — BP 120/70 | HR 79 | Temp 98.7°F | Ht 67.0 in | Wt 160.8 lb

## 2011-03-11 DIAGNOSIS — Z23 Encounter for immunization: Secondary | ICD-10-CM

## 2011-03-11 DIAGNOSIS — E785 Hyperlipidemia, unspecified: Secondary | ICD-10-CM

## 2011-03-11 DIAGNOSIS — E119 Type 2 diabetes mellitus without complications: Secondary | ICD-10-CM

## 2011-03-11 DIAGNOSIS — Z0000 Encounter for general adult medical examination without abnormal findings: Secondary | ICD-10-CM

## 2011-03-11 MED ORDER — PRAVASTATIN SODIUM 40 MG PO TABS: 20.0000 mg | ORAL_TABLET | Freq: Every day | ORAL | Status: DC

## 2011-03-11 MED ORDER — PRAVASTATIN SODIUM 40 MG PO TABS: 40.0000 mg | ORAL_TABLET | Freq: Every day | ORAL | Status: DC

## 2011-03-11 NOTE — Assessment & Plan Note (Signed)
LDL not at goal < 100.. Increase pravastatin to 40 mg daily. Recheck in 3 months. Encouraged exercise, weight loss, healthy eating habits.

## 2011-03-11 NOTE — Progress Notes (Signed)
Subjective:    Patient ID: [name], female    DOB: 09-10-1970, 41 y.o.   MRN: [id number]  HPI  Diabetes: Well controlled on Lantus  28 Units daily and metformin Lab Results  Component Value Date   HGBA1C 6.4 02/13/2011  Using medications without difficulties: Hypoglycemic episodes: Hyperglycemic episodes: Feet problems: Blood Sugars averaging: eye exam within last year:  Elevated Cholesterol: Almost at goal <100 LDL on pravastatin 20 mg daily. Using medications without problems: None Muscle aches: None Diet compliance: Good Exercise:2-3 times a week walks Other complaints: BP at goal    Review of Systems  Constitutional: Negative for fever, fatigue and unexpected weight change.  HENT: Negative for ear pain, congestion, sore throat, sneezing, trouble swallowing and sinus pressure.   Eyes: Negative for pain and itching.  Respiratory: Negative for cough, shortness of breath and wheezing.   Cardiovascular: Negative for chest pain, palpitations and leg swelling.  Gastrointestinal: Negative for nausea, abdominal pain, diarrhea, constipation and blood in stool.  Genitourinary: Negative for dysuria, hematuria, vaginal discharge, difficulty urinating and menstrual problem.  Skin: Negative for rash.  Neurological: Negative for syncope, weakness, light-headedness, numbness and headaches.  Psychiatric/Behavioral: Negative for confusion and dysphoric mood. The patient is not nervous/anxious.        Objective:   Physical Exam  Constitutional: Vital signs are normal. She appears well-developed and well-nourished. She is cooperative.  Non-toxic appearance. She does not appear ill. No distress.  HENT:  Head: Normocephalic.  Right Ear: Hearing, tympanic membrane, external ear and ear canal normal.  Left Ear: Hearing, tympanic membrane, external ear and ear canal normal.  Nose: Nose normal.  Eyes: Conjunctivae, EOM and lids are normal. Pupils are equal, round, and reactive to  light. No foreign bodies found.  Neck: Trachea normal and normal range of motion. Neck supple. Carotid bruit is not present. No mass and no thyromegaly present.  Cardiovascular: Normal rate, regular rhythm, S1 normal, S2 normal, normal heart sounds and intact distal pulses.  Exam reveals no gallop.   No murmur heard. Pulmonary/Chest: Effort normal and breath sounds normal. No respiratory distress. She has no wheezes. She has no rhonchi. She has no rales.  Abdominal: Soft. Normal appearance and bowel sounds are normal. She exhibits no distension, no fluid wave, no abdominal bruit and no mass. There is no hepatosplenomegaly. There is no tenderness. There is no rebound, no guarding and no CVA tenderness. No hernia.  Genitourinary:       Will return for breast, pap, DVE exam  Lymphadenopathy:    She has no cervical adenopathy.    She has no axillary adenopathy.  Neurological: She is alert. She has normal strength. No cranial nerve deficit or sensory deficit.  Skin: Skin is warm, dry and intact. No rash noted.  Psychiatric: Her speech is normal and behavior is normal. Judgment normal. Her mood appears not anxious. Cognition and memory are normal. She does not exhibit a depressed mood.      Diabetic foot exam: Normal inspection No skin breakdown No calluses  Normal DP pulses Normal sensation to light touch and monofilament Nails normal     Assessment & Plan:  CPX: The patient's preventative maintenance and recommended screening tests for an annual wellness exam were reviewed in full today. Brought up to date unless services declined.  Counselled on the importance of diet, exercise, and its role in overall health and mortality. The patient's FH and SH was reviewed, including their home life, tobacco status, and drug and  alcohol status.   DVE/ PAP/breast exam: she will return for vaginal exam. Vaccines: Td uptodate, flu  And PNA vaccine given today.

## 2011-03-11 NOTE — Patient Instructions (Addendum)
Make return appt for pap only when able  Increase pravastatin 40 mg daily. Return in 07/2011 for DM and chol check , fasting labs prior. Can do pap at this visit as long as it is a longer appt.

## 2011-03-11 NOTE — Assessment & Plan Note (Addendum)
Well- controlled on current medication 

## 2011-08-13 ENCOUNTER — Other Ambulatory Visit: Payer: Self-pay

## 2011-08-13 ENCOUNTER — Ambulatory Visit: Payer: Self-pay | Admitting: Family Medicine

## 2011-08-14 ENCOUNTER — Ambulatory Visit: Payer: Self-pay | Admitting: Family Medicine

## 2011-09-10 ENCOUNTER — Other Ambulatory Visit (INDEPENDENT_AMBULATORY_CARE_PROVIDER_SITE_OTHER): Payer: 59

## 2011-09-10 DIAGNOSIS — E119 Type 2 diabetes mellitus without complications: Secondary | ICD-10-CM

## 2011-09-10 DIAGNOSIS — E785 Hyperlipidemia, unspecified: Secondary | ICD-10-CM

## 2011-09-10 LAB — COMPREHENSIVE METABOLIC PANEL
ALT: 13 U/L (ref 0–35)
AST: 17 U/L (ref 0–37)
Albumin: 4.1 g/dL (ref 3.5–5.2)
Alkaline Phosphatase: 48 U/L (ref 39–117)
BUN: 20 mg/dL (ref 6–23)
Potassium: 3.8 mEq/L (ref 3.5–5.1)
Sodium: 139 mEq/L (ref 135–145)
Total Protein: 7.7 g/dL (ref 6.0–8.3)

## 2011-09-10 LAB — LIPID PANEL
Total CHOL/HDL Ratio: 4
Triglycerides: 165 mg/dL — ABNORMAL HIGH (ref 0.0–149.0)

## 2011-09-10 LAB — HEMOGLOBIN A1C: Hgb A1c MFr Bld: 7 % — ABNORMAL HIGH (ref 4.6–6.5)

## 2011-09-10 LAB — LDL CHOLESTEROL, DIRECT: Direct LDL: 127 mg/dL

## 2011-09-17 ENCOUNTER — Other Ambulatory Visit (HOSPITAL_COMMUNITY)
Admission: RE | Admit: 2011-09-17 | Discharge: 2011-09-17 | Disposition: A | Discharge status: Home / Self Care | Payer: 59 | Source: Ambulatory Visit | Attending: Family Medicine | Admitting: Family Medicine

## 2011-09-17 ENCOUNTER — Encounter: Payer: Self-pay | Admitting: Family Medicine

## 2011-09-17 ENCOUNTER — Ambulatory Visit (INDEPENDENT_AMBULATORY_CARE_PROVIDER_SITE_OTHER): Payer: 59 | Admitting: Family Medicine

## 2011-09-17 VITALS — BP 120/60 | HR 82 | Temp 98.1°F | Ht 67.0 in | Wt 164.5 lb

## 2011-09-17 DIAGNOSIS — Z124 Encounter for screening for malignant neoplasm of cervix: Secondary | ICD-10-CM

## 2011-09-17 DIAGNOSIS — E785 Hyperlipidemia, unspecified: Secondary | ICD-10-CM

## 2011-09-17 DIAGNOSIS — Z1151 Encounter for screening for human papillomavirus (HPV): Secondary | ICD-10-CM | POA: Insufficient documentation

## 2011-09-17 DIAGNOSIS — Z01419 Encounter for gynecological examination (general) (routine) without abnormal findings: Secondary | ICD-10-CM | POA: Insufficient documentation

## 2011-09-17 DIAGNOSIS — E119 Type 2 diabetes mellitus without complications: Secondary | ICD-10-CM

## 2011-09-17 MED ORDER — PRAVASTATIN SODIUM 40 MG PO TABS: 40.0000 mg | ORAL_TABLET | Freq: Every day | ORAL | Status: DC

## 2011-09-17 NOTE — Assessment & Plan Note (Signed)
Poor control due to compliance with diet and exercise. Will get back on track. Continue current meds as previously A1C 6.4.

## 2011-09-17 NOTE — Progress Notes (Signed)
Subjective:    Patient ID: [name], female    DOB: 1970/09/29, 41 y.o.   MRN: [id number]  HPI  Had CPX in 03/2011.Marland Kitchen Could not do DVE/pap that day. Will perform today.   Elevated Cholesterol:  LDL not at goal <100 on pravachol 20, she never filled the 40 mg daily. Lab Results  Component Value Date   CHOL 211* 09/10/2011   HDL 59.60 09/10/2011   LDLCALC 114* 02/13/2011   LDLDIRECT 127.0 09/10/2011   TRIG 165.0* 09/10/2011   CHOLHDL 4 09/10/2011  Using medications without problems:None Muscle aches: None Diet compliance:moderate Exercise:None Other complaints:  Diabetes: Worsened control.  On glucophage, lantus Lab Results  Component Value Date   HGBA1C 7.0* 09/10/2011  Using medications without difficulties: Hypoglycemic episodes:? Hyperglycemic episodes:? Feet problems:None Blood Sugars averaging:not chekcing.. Busy at [facility] eye exam within last year: yes  3-4 lb weight gain.    Review of Systems  Constitutional: Negative for fever and fatigue.  HENT: Negative for ear pain.   Eyes: Negative for pain.  Respiratory: Negative for chest tightness and shortness of breath.   Cardiovascular: Negative for chest pain, palpitations and leg swelling.  Gastrointestinal: Negative for abdominal pain.  Genitourinary: Negative for dysuria.       Objective:   Physical Exam  Constitutional: Vital signs are normal. She appears well-developed and well-nourished. She is cooperative.  Non-toxic appearance. She does not appear ill. No distress.  HENT:  Head: Normocephalic.  Right Ear: Hearing, tympanic membrane, external ear and ear canal normal. Tympanic membrane is not erythematous, not retracted and not bulging.  Left Ear: Hearing, tympanic membrane, external ear and ear canal normal. Tympanic membrane is not erythematous, not retracted and not bulging.  Nose: Nose normal. No mucosal edema or rhinorrhea. Right sinus exhibits no maxillary sinus tenderness and no frontal sinus  tenderness. Left sinus exhibits no maxillary sinus tenderness and no frontal sinus tenderness.  Mouth/Throat: Uvula is midline, oropharynx is clear and moist and mucous membranes are normal.  Eyes: Conjunctivae, EOM and lids are normal. Pupils are equal, round, and reactive to light. No foreign bodies found.  Neck: Trachea normal and normal range of motion. Neck supple. Carotid bruit is not present. No mass and no thyromegaly present.  Cardiovascular: Normal rate, regular rhythm, S1 normal, S2 normal, normal heart sounds, intact distal pulses and normal pulses.  Exam reveals no gallop and no friction rub.   No murmur heard. Pulmonary/Chest: Effort normal and breath sounds normal. Not tachypneic. No respiratory distress. She has no decreased breath sounds. She has no wheezes. She has no rhonchi. She has no rales.  Abdominal: Soft. Normal appearance and bowel sounds are normal. She exhibits no distension, no fluid wave, no abdominal bruit and no mass. There is no hepatosplenomegaly. There is no tenderness. There is no rebound, no guarding and no CVA tenderness. No hernia.  Genitourinary: Vagina normal and uterus normal. No breast swelling, tenderness, discharge or bleeding. Pelvic exam was performed with patient prone. There is no rash, tenderness or lesion on the right labia. There is no rash, tenderness or lesion on the left labia. Uterus is not enlarged and not tender. Cervix exhibits no motion tenderness, no discharge and no friability. Right adnexum displays no mass, no tenderness and no fullness. Left adnexum displays no mass, no tenderness and no fullness.  Lymphadenopathy:    She has no cervical adenopathy.    She has no axillary adenopathy.  Neurological: She is alert. She has normal strength. No  cranial nerve deficit or sensory deficit.  Skin: Skin is warm, dry and intact. No rash noted.  Psychiatric: Her speech is normal and behavior is normal. Judgment and thought content normal. Her mood  appears not anxious. Cognition and memory are normal. She does not exhibit a depressed mood.     Diabetic foot exam: Normal inspection No skin breakdown No calluses  Normal DP pulses Normal sensation to light touch and monofilament Nails normal      Assessment & Plan:

## 2011-09-17 NOTE — Patient Instructions (Addendum)
Go ahead and increase prescription for pravastatin 40 mg daily.   Get back on track with diet and exercise. Work on weight loss.

## 2011-09-17 NOTE — Assessment & Plan Note (Signed)
Poor control. Never increased pravastatin to 40... Will go ahead and do this.

## 2011-09-25 ENCOUNTER — Encounter: Payer: Self-pay | Admitting: *Deleted

## 2011-10-25 ENCOUNTER — Other Ambulatory Visit: Payer: Self-pay | Admitting: Family Medicine

## 2011-11-27 ENCOUNTER — Other Ambulatory Visit: Payer: Self-pay

## 2011-11-27 ENCOUNTER — Other Ambulatory Visit: Payer: Self-pay | Admitting: *Deleted

## 2011-11-27 MED ORDER — METFORMIN HCL ER 750 MG PO TB24: ORAL_TABLET | ORAL | Status: DC

## 2011-11-27 MED ORDER — INSULIN GLARGINE 100 UNIT/ML ~~LOC~~ SOLN: SUBCUTANEOUS | Status: DC

## 2011-11-27 NOTE — Telephone Encounter (Signed)
Metformin 750 mg 24 hr #180 3 R. Sent to [facility]

## 2011-12-08 ENCOUNTER — Other Ambulatory Visit: Payer: 59

## 2011-12-18 ENCOUNTER — Ambulatory Visit: Payer: 59 | Admitting: Family Medicine

## 2011-12-18 DIAGNOSIS — Z0289 Encounter for other administrative examinations: Secondary | ICD-10-CM

## 2012-06-21 LAB — HM DIABETES EYE EXAM

## 2012-07-15 ENCOUNTER — Other Ambulatory Visit: Payer: Self-pay | Admitting: *Deleted

## 2012-07-15 MED ORDER — METFORMIN HCL ER 750 MG PO TB24: ORAL_TABLET | ORAL | Status: DC

## 2012-07-15 MED ORDER — PRAVASTATIN SODIUM 40 MG PO TABS: 40.0000 mg | ORAL_TABLET | Freq: Every day | ORAL | Status: DC

## 2012-07-15 MED ORDER — INSULIN GLARGINE 100 UNIT/ML ~~LOC~~ SOLN: SUBCUTANEOUS | Status: DC

## 2012-07-27 ENCOUNTER — Other Ambulatory Visit: Payer: Self-pay | Admitting: *Deleted

## 2012-07-27 MED ORDER — PRAVASTATIN SODIUM 40 MG PO TABS: 40.0000 mg | ORAL_TABLET | Freq: Every day | ORAL | Status: DC

## 2012-07-29 ENCOUNTER — Encounter: Payer: Self-pay | Admitting: Family Medicine

## 2012-11-24 ENCOUNTER — Encounter: Payer: Self-pay | Admitting: Family Medicine

## 2012-11-24 ENCOUNTER — Ambulatory Visit (INDEPENDENT_AMBULATORY_CARE_PROVIDER_SITE_OTHER): Payer: 59 | Admitting: Family Medicine

## 2012-11-24 VITALS — BP 120/98 | HR 83 | Temp 98.3°F | Ht 67.0 in | Wt 167.8 lb

## 2012-11-24 DIAGNOSIS — R5381 Other malaise: Secondary | ICD-10-CM | POA: Insufficient documentation

## 2012-11-24 DIAGNOSIS — Z23 Encounter for immunization: Secondary | ICD-10-CM

## 2012-11-24 DIAGNOSIS — E785 Hyperlipidemia, unspecified: Secondary | ICD-10-CM

## 2012-11-24 DIAGNOSIS — E119 Type 2 diabetes mellitus without complications: Secondary | ICD-10-CM

## 2012-11-24 MED ORDER — PRAVASTATIN SODIUM 40 MG PO TABS: 40.0000 mg | ORAL_TABLET | Freq: Every day | ORAL | Status: DC

## 2012-11-24 MED ORDER — INSULIN GLARGINE 100 UNIT/ML SOLOSTAR PEN: 30.0000 [IU] | PEN_INJECTOR | Freq: Every day | SUBCUTANEOUS | Status: DC

## 2012-11-24 MED ORDER — METFORMIN HCL ER 750 MG PO TB24: ORAL_TABLET | ORAL | Status: DC

## 2012-11-24 NOTE — Progress Notes (Signed)
42 year old female returns for medication refill and DM follow up.  She has been tired lately in last few months, increase stress with daughter starting. Body ache later in the day after exercise.  Elevated Cholesterol: LDL not at goal <100 on pravachol 20mg  ( last OV increased to 40 mg) last check... Due for re-eval.  Lab Results  Component Value Date   CHOL 211* 09/10/2011   HDL 59.60 09/10/2011   LDLCALC 114* 02/13/2011   LDLDIRECT 127.0 09/10/2011   TRIG 165.0* 09/10/2011   CHOLHDL 4 09/10/2011  Using medications without problems:She does feel like she is achy after exercise.  Muscle aches: None  Diet compliance: no fast food,  Trying low carbs but still eating potatos Exercise: Going 1 hour a day, three times a week. Other complaints:   Diabetes: Due for eval. On glucophage, lantus 28-30... If exercising she is decreasing to 24 units. Lab Results   Component  Value  Date    HGBA1C  7.0*  09/10/2011   Using medications without difficulties:  Hypoglycemic episodes: Hyperglycemic episodes:  Feet problems:None  Blood Sugars averaging: fasting 120-140, post prandial 180-260 eye exam within last year: yes  3-4 lb weight gain.  Wt Readings from Last 3 Encounters:  11/24/12 167 lb 12.8 oz (76.114 kg)  09/17/11 164 lb 8 oz (74.617 kg)  03/11/11 160 lb 12.8 oz (72.938 kg)    Review of Systems  Constitutional: Negative for fever and fatigue.  HENT: Negative for ear pain.  Eyes: Negative for pain.  Respiratory: Negative for chest tightness and shortness of breath.  Cardiovascular: Negative for chest pain, palpitations and leg swelling.  Gastrointestinal: Negative for abdominal pain.  Genitourinary: Negative for dysuria.  Objective:   Physical Exam  Constitutional: Vital signs are normal. She appears well-developed and well-nourished. She is cooperative. Non-toxic appearance. She does not appear ill. No distress.  HENT:  Head: Normocephalic.  Right Ear: Hearing, tympanic  membrane, external ear and ear canal normal. Tympanic membrane is not erythematous, not retracted and not bulging.  Left Ear: Hearing, tympanic membrane, external ear and ear canal normal. Tympanic membrane is not erythematous, not retracted and not bulging.  Nose: Nose normal. No mucosal edema or rhinorrhea. Right sinus exhibits no maxillary sinus tenderness and no frontal sinus tenderness. Left sinus exhibits no maxillary sinus tenderness and no frontal sinus tenderness.  Mouth/Throat: Uvula is midline, oropharynx is clear and moist and mucous membranes are normal.  Eyes: Conjunctivae, EOM and lids are normal. Pupils are equal, round, and reactive to light. No foreign bodies found.  Neck: Trachea normal and normal range of motion. Neck supple. Carotid bruit is not present. No mass and no thyromegaly present.  Cardiovascular: Normal rate, regular rhythm, S1 normal, S2 normal, normal heart sounds, intact distal pulses and normal pulses. Exam reveals no gallop and no friction rub.  No murmur heard.  Pulmonary/Chest: Effort normal and breath sounds normal. Not tachypneic. No respiratory distress. She has no decreased breath sounds. She has no wheezes. She has no rhonchi. She has no rales.  Abdominal: Soft. Normal appearance and bowel sounds are normal. She exhibits no distension, no fluid wave, no abdominal bruit and no mass. There is no hepatosplenomegaly. There is no tenderness. There is no rebound, no guarding and no CVA tenderness. No hernia.  Genitourinary: Vagina normal and uterus normal. No breast swelling, tenderness, discharge or bleeding. Pelvic exam was performed with patient prone. There is no rash, tenderness or lesion on the right labia. There  is no rash, tenderness or lesion on the left labia. Uterus is not enlarged and not tender. Cervix exhibits no motion tenderness, no discharge and no friability. Right adnexum displays no mass, no tenderness and no fullness. Left adnexum displays no mass,  no tenderness and no fullness.  Lymphadenopathy:  She has no cervical adenopathy.  She has no axillary adenopathy.  Neurological: She is alert. She has normal strength. No cranial nerve deficit or sensory deficit.  Skin: Skin is warm, dry and intact. No rash noted.  Psychiatric: Her speech is normal and behavior is normal. Judgment and thought content normal. Her mood appears not anxious. Cognition and memory are normal. She does not exhibit a depressed mood.   Diabetic foot exam:  Normal inspection  No skin breakdown  No calluses  Normal DP pulses  Normal sensation to light touch and monofilament  Nails normal

## 2012-11-24 NOTE — Assessment & Plan Note (Signed)
Due for re-eval.  needs closer folow up than yearly. Pt instructed. Encouraged exercise, weight loss, healthy eating habits.

## 2012-11-24 NOTE — Patient Instructions (Addendum)
Work on low [employer]. Try not to skip meals.  Return for fasting labs in next few days. Work on increasing exercise. Can decrease lantus on those day as you are. Schedule CPX in next 3 months with fasting labs.

## 2012-11-24 NOTE — Assessment & Plan Note (Signed)
Due for re-eval. 

## 2012-11-24 NOTE — Assessment & Plan Note (Signed)
Likely due to stress. Not clearly associated with low blood sugars. Eval with labs.

## 2012-11-30 ENCOUNTER — Other Ambulatory Visit (INDEPENDENT_AMBULATORY_CARE_PROVIDER_SITE_OTHER): Payer: 59

## 2012-11-30 DIAGNOSIS — E785 Hyperlipidemia, unspecified: Secondary | ICD-10-CM

## 2012-11-30 DIAGNOSIS — R5381 Other malaise: Secondary | ICD-10-CM

## 2012-11-30 DIAGNOSIS — E119 Type 2 diabetes mellitus without complications: Secondary | ICD-10-CM

## 2012-11-30 DIAGNOSIS — D649 Anemia, unspecified: Secondary | ICD-10-CM

## 2012-11-30 LAB — COMPREHENSIVE METABOLIC PANEL
Albumin: 3.8 g/dL (ref 3.5–5.2)
Alkaline Phosphatase: 50 U/L (ref 39–117)
BUN: 14 mg/dL (ref 6–23)
CO2: 27 mEq/L (ref 19–32)
Calcium: 8.6 mg/dL (ref 8.4–10.5)
Chloride: 106 mEq/L (ref 96–112)
GFR: 117.67 mL/min (ref >60.00)
Glucose, Bld: 172 mg/dL — ABNORMAL HIGH (ref 70–99)
Potassium: 3.9 mEq/L (ref 3.5–5.1)
Sodium: 137 mEq/L (ref 135–145)
Total Protein: 7.1 g/dL (ref 6.0–8.3)

## 2012-11-30 LAB — CBC WITH DIFFERENTIAL/PLATELET
Basophils Absolute: 0 10*3/uL (ref 0.0–0.1)
Basophils Relative: 0.3 % (ref 0.0–3.0)
Eosinophils Absolute: 0.2 10*3/uL (ref 0.0–0.7)
Eosinophils Relative: 2 % (ref 0.0–5.0)
Lymphocytes Relative: 28 % (ref 12.0–46.0)
Lymphs Abs: 2.6 10*3/uL (ref 0.7–4.0)
MCHC: 34 g/dL (ref 30.0–36.0)
Monocytes Relative: 6.3 % (ref 3.0–12.0)
Neutrophils Relative %: 63.4 % (ref 43.0–77.0)
RBC: 4.15 Mil/uL (ref 3.87–5.11)
WBC: 9.3 10*3/uL (ref 4.5–10.5)

## 2012-11-30 LAB — LIPID PANEL
Cholesterol: 199 mg/dL (ref 0–200)
LDL Cholesterol: 126 mg/dL — ABNORMAL HIGH (ref 0–99)

## 2012-11-30 LAB — VITAMIN B12: Vitamin B-12: 354 pg/mL (ref 211–911)

## 2012-11-30 LAB — TSH: TSH: 1.41 u[IU]/mL (ref 0.35–5.50)

## 2012-12-01 LAB — VITAMIN D 25 HYDROXY (VIT D DEFICIENCY, FRACTURES): Vit D, 25-Hydroxy: 37 ng/mL (ref 30–89)

## 2012-12-05 ENCOUNTER — Other Ambulatory Visit: Payer: Self-pay | Admitting: Family Medicine

## 2012-12-07 ENCOUNTER — Other Ambulatory Visit: Payer: Self-pay | Admitting: *Deleted

## 2012-12-07 MED ORDER — PRAVASTATIN SODIUM 40 MG PO TABS: 40.0000 mg | ORAL_TABLET | Freq: Every day | ORAL | Status: DC

## 2012-12-07 MED ORDER — INSULIN GLARGINE 100 UNIT/ML SOLOSTAR PEN: 30.0000 [IU] | PEN_INJECTOR | Freq: Every day | SUBCUTANEOUS | Status: DC

## 2013-02-20 ENCOUNTER — Other Ambulatory Visit (INDEPENDENT_AMBULATORY_CARE_PROVIDER_SITE_OTHER): Payer: 59

## 2013-02-20 DIAGNOSIS — E119 Type 2 diabetes mellitus without complications: Secondary | ICD-10-CM

## 2013-02-20 DIAGNOSIS — Z79899 Other long term (current) drug therapy: Secondary | ICD-10-CM

## 2013-02-20 DIAGNOSIS — E7800 Pure hypercholesterolemia, unspecified: Secondary | ICD-10-CM

## 2013-02-20 LAB — COMPREHENSIVE METABOLIC PANEL
AST: 12 U/L (ref 0–37)
Alkaline Phosphatase: 44 U/L (ref 39–117)
BUN: 12 mg/dL (ref 6–23)
Calcium: 8.8 mg/dL (ref 8.4–10.5)
Chloride: 104 mEq/L (ref 96–112)
Creat: 0.58 mg/dL (ref 0.50–1.10)
Potassium: 4 mEq/L (ref 3.5–5.3)
Sodium: 137 mEq/L (ref 135–145)
Total Protein: 6.6 g/dL (ref 6.0–8.3)

## 2013-02-20 LAB — LIPID PANEL
Cholesterol: 182 mg/dL (ref 0–200)
Total CHOL/HDL Ratio: 3.3 Ratio
Triglycerides: 73 mg/dL (ref <150)
VLDL: 15 mg/dL (ref 0–40)

## 2013-03-01 ENCOUNTER — Encounter: Payer: Self-pay | Admitting: Family Medicine

## 2013-03-01 ENCOUNTER — Ambulatory Visit (INDEPENDENT_AMBULATORY_CARE_PROVIDER_SITE_OTHER): Payer: 59 | Admitting: Family Medicine

## 2013-03-01 VITALS — BP 108/68 | HR 76 | Temp 98.2°F | Ht 67.0 in | Wt 164.2 lb

## 2013-03-01 DIAGNOSIS — Z0000 Encounter for general adult medical examination without abnormal findings: Secondary | ICD-10-CM

## 2013-03-01 DIAGNOSIS — E119 Type 2 diabetes mellitus without complications: Secondary | ICD-10-CM

## 2013-03-01 DIAGNOSIS — E785 Hyperlipidemia, unspecified: Secondary | ICD-10-CM

## 2013-03-01 DIAGNOSIS — E7800 Pure hypercholesterolemia, unspecified: Secondary | ICD-10-CM

## 2013-03-01 NOTE — Patient Instructions (Addendum)
Schedule DVE/ breast exam only as well as DM follow up in 3 months, fasting labs prior. ( 30 min OV)  Schedule mammogram on own. Work on [employer], exercise and weight loss.

## 2013-03-01 NOTE — Progress Notes (Signed)
Pre-visit discussion using our clinic review tool. No additional management support is needed unless otherwise documented below in the visit note.  

## 2013-03-01 NOTE — Progress Notes (Signed)
The patient is here for annual wellness exam and preventative care.    Diabetes: Moderate control on Lantus 28 Units daily and metformin  Lab Results  Component Value Date   HGBA1C 7.2* 02/20/2013  Using medications without difficulties:  Hypoglycemic episodes: None Hyperglycemic episodes: occ Feet problems: None Blood Sugars averaging:  FBS 80-120, 2 hr postprandial 200 eye exam within last year: yes  Elevated Cholesterol: Almost at goal <100 LDL on pravastatin 20 mg daily.  Lab Results  Component Value Date   CHOL 182 02/20/2013   HDL 56 02/20/2013   LDLCALC 111* 02/20/2013   LDLDIRECT 127.0 09/10/2011   TRIG 73 02/20/2013   CHOLHDL 3.3 02/20/2013  Using medications without problems: None  Muscle aches: None  Diet compliance: Good  Exercise:2-3 times a week walks  Other complaints: BP at goal   Review of Systems  Constitutional: Negative for fever, fatigue and unexpected weight change.  HENT: Negative for ear pain, congestion, sore throat, sneezing, trouble swallowing and sinus pressure.  Eyes: Negative for pain and itching.  Respiratory: Negative for cough, shortness of breath and wheezing.  Cardiovascular: Negative for chest pain, palpitations and leg swelling.  Gastrointestinal: Negative for nausea, abdominal pain, diarrhea, constipation and blood in stool.  Genitourinary: Negative for dysuria, hematuria, vaginal discharge, difficulty urinating and menstrual problem.  Skin: Negative for rash.  Neurological: Negative for syncope, weakness, light-headedness, numbness and headaches.  Psychiatric/Behavioral: Negative for confusion and dysphoric mood. The patient is not nervous/anxious.  Objective:   Physical Exam  Constitutional: Vital signs are normal. She appears well-developed and well-nourished. She is cooperative. Non-toxic appearance. She does not appear ill. No distress.  HENT:  Head: Normocephalic.  Right Ear: Hearing, tympanic membrane, external ear and ear canal  normal.  Left Ear: Hearing, tympanic membrane, external ear and ear canal normal.  Nose: Nose normal.  Eyes: Conjunctivae, EOM and lids are normal. Pupils are equal, round, and reactive to light. No foreign bodies found.  Neck: Trachea normal and normal range of motion. Neck supple. Carotid bruit is not present. No mass and no thyromegaly present.  Cardiovascular: Normal rate, regular rhythm, S1 normal, S2 normal, normal heart sounds and intact distal pulses. Exam reveals no gallop.  No murmur heard.  Pulmonary/Chest: Effort normal and breath sounds normal. No respiratory distress. She has no wheezes. She has no rhonchi. She has no rales.  Abdominal: Soft. Normal appearance and bowel sounds are normal. She exhibits no distension, no fluid wave, no abdominal bruit and no mass. There is no hepatosplenomegaly. There is no tenderness. There is no rebound, no guarding and no CVA tenderness. No hernia.  Genitourinary: she refuses vaginal exam today given menses, will return for breast and DVE. Lymphadenopathy:  She has no cervical adenopathy.  She has no axillary adenopathy.  Neurological: She is alert. She has normal strength. No cranial nerve deficit or sensory deficit.  Skin: Skin is warm, dry and intact. No rash noted.  Psychiatric: Her speech is normal and behavior is normal. Judgment normal. Her mood appears not anxious. Cognition and memory are normal. She does not exhibit a depressed mood.   Diabetic foot exam:  Normal inspection  No skin breakdown  No calluses  Normal DP pulses  Normal sensation to light touch and monofilament  Nails normal  Assessment & Plan:   CPX: The patient's preventative maintenance and recommended screening tests for an annual wellness exam were reviewed in full today.  Brought up to date unless services declined.  Counselled on  the importance of diet, exercise, and its role in overall health and mortality.  The patient's FH and SH was reviewed, including their  home life, tobacco status, and drug and alcohol status.   DVE/PAP/breast exam: nml pap 2013, has had three nmls in a row, on q3 schedule Vaccines: Td uptodate, flu and PNA vaccine uptodate Mammo: will schedule. Former smoker 14 ppy

## 2013-03-01 NOTE — Assessment & Plan Note (Signed)
Improving control with lifestyle changes on current meds.

## 2013-03-01 NOTE — Assessment & Plan Note (Signed)
Almost at goal on current med. Encouraged exercise, weight loss, healthy eating habits.

## 2013-05-30 ENCOUNTER — Ambulatory Visit: Payer: 59 | Admitting: Family Medicine

## 2013-09-18 LAB — HM DIABETES EYE EXAM

## 2013-09-19 ENCOUNTER — Encounter: Payer: Self-pay | Admitting: Family Medicine

## 2013-09-20 ENCOUNTER — Telehealth: Payer: Self-pay | Admitting: Family Medicine

## 2013-09-20 NOTE — Telephone Encounter (Signed)
Diabetic Bundle.  Pt needs lab appt for LDL check and appt for diabetic follow up.  Left vm to return call.

## 2013-11-30 ENCOUNTER — Other Ambulatory Visit: Payer: Self-pay | Admitting: Family Medicine

## 2013-12-07 ENCOUNTER — Other Ambulatory Visit (INDEPENDENT_AMBULATORY_CARE_PROVIDER_SITE_OTHER): Payer: BC Managed Care – PPO

## 2013-12-07 ENCOUNTER — Telehealth: Payer: Self-pay | Admitting: Family Medicine

## 2013-12-07 DIAGNOSIS — E785 Hyperlipidemia, unspecified: Secondary | ICD-10-CM

## 2013-12-07 DIAGNOSIS — E119 Type 2 diabetes mellitus without complications: Secondary | ICD-10-CM

## 2013-12-07 DIAGNOSIS — D509 Iron deficiency anemia, unspecified: Secondary | ICD-10-CM

## 2013-12-07 LAB — COMPREHENSIVE METABOLIC PANEL
ALT: 12 U/L (ref 0–35)
AST: 15 U/L (ref 0–37)
Albumin: 3.3 g/dL — ABNORMAL LOW (ref 3.5–5.2)
Alkaline Phosphatase: 51 U/L (ref 39–117)
BILIRUBIN TOTAL: 0.7 mg/dL (ref 0.2–1.2)
BUN: 13 mg/dL (ref 6–23)
CO2: 24 mEq/L (ref 19–32)
Calcium: 8.7 mg/dL (ref 8.4–10.5)
Chloride: 104 mEq/L (ref 96–112)
Creatinine, Ser: 0.6 mg/dL (ref 0.4–1.2)
GFR: 129.44 mL/min (ref >60.00)
GLUCOSE: 212 mg/dL — AB (ref 70–99)
Potassium: 4.1 mEq/L (ref 3.5–5.1)
Sodium: 137 mEq/L (ref 135–145)
Total Protein: 7.1 g/dL (ref 6.0–8.3)

## 2013-12-07 LAB — LIPID PANEL
Cholesterol: 192 mg/dL (ref 0–200)
HDL: 53.3 mg/dL (ref >39.00)
LDL Cholesterol: 117 mg/dL — ABNORMAL HIGH (ref 0–99)
NONHDL: 138.7
Total CHOL/HDL Ratio: 4
Triglycerides: 109 mg/dL (ref 0.0–149.0)
VLDL: 21.8 mg/dL (ref 0.0–40.0)

## 2013-12-07 LAB — CBC WITH DIFFERENTIAL/PLATELET
BASOS ABS: 0 10*3/uL (ref 0.0–0.1)
Basophils Relative: 0.5 % (ref 0.0–3.0)
EOS ABS: 0.3 10*3/uL (ref 0.0–0.7)
Eosinophils Relative: 3.8 % (ref 0.0–5.0)
HEMATOCRIT: 39.5 % (ref 36.0–46.0)
Hemoglobin: 13.2 g/dL (ref 12.0–15.0)
LYMPHS ABS: 2.2 10*3/uL (ref 0.7–4.0)
Lymphocytes Relative: 26.6 % (ref 12.0–46.0)
MCHC: 33.3 g/dL (ref 30.0–36.0)
MCV: 93.1 fl (ref 78.0–100.0)
Monocytes Absolute: 0.6 10*3/uL (ref 0.1–1.0)
Monocytes Relative: 7.1 % (ref 3.0–12.0)
Neutro Abs: 5.2 10*3/uL (ref 1.4–7.7)
Neutrophils Relative %: 62 % (ref 43.0–77.0)
PLATELETS: 289 10*3/uL (ref 150.0–400.0)
RBC: 4.25 Mil/uL (ref 3.87–5.11)
RDW: 13.1 % (ref 11.5–15.5)
WBC: 8.4 10*3/uL (ref 4.0–10.5)

## 2013-12-07 LAB — MICROALBUMIN / CREATININE URINE RATIO
Creatinine,U: 324 mg/dL
Microalb Creat Ratio: 0.5 mg/g (ref 0.0–30.0)
Microalb, Ur: 1.7 mg/dL (ref 0.0–1.9)

## 2013-12-07 LAB — HEMOGLOBIN A1C: HEMOGLOBIN A1C: 7.9 % — AB (ref 4.6–6.5)

## 2013-12-07 NOTE — Telephone Encounter (Signed)
Message copied by Excell SeltzerBEDSOLE, Terril Amaro E on Thu Dec 07, 2013  8:18 AM ------      Message from: Alvina ChouWALSH, TERRI J      Created: Thu Nov 30, 2013  4:23 PM      Regarding: Lab orders for Thursday,10.8.15       Lab orders for f/u ------

## 2013-12-07 NOTE — Addendum Note (Signed)
Addended by: Baldomero LamyHAVERS, Ayonna Speranza C on: 12/07/2013 08:43 AM   Modules accepted: Orders

## 2013-12-12 ENCOUNTER — Ambulatory Visit (INDEPENDENT_AMBULATORY_CARE_PROVIDER_SITE_OTHER): Payer: BC Managed Care – PPO | Admitting: Family Medicine

## 2013-12-12 ENCOUNTER — Encounter: Payer: Self-pay | Admitting: Family Medicine

## 2013-12-12 VITALS — BP 100/60 | HR 75 | Temp 98.2°F | Ht 67.0 in | Wt 169.8 lb

## 2013-12-12 DIAGNOSIS — E1165 Type 2 diabetes mellitus with hyperglycemia: Secondary | ICD-10-CM

## 2013-12-12 DIAGNOSIS — E785 Hyperlipidemia, unspecified: Secondary | ICD-10-CM

## 2013-12-12 DIAGNOSIS — IMO0001 Reserved for inherently not codable concepts without codable children: Secondary | ICD-10-CM

## 2013-12-12 DIAGNOSIS — D509 Iron deficiency anemia, unspecified: Secondary | ICD-10-CM

## 2013-12-12 LAB — HM DIABETES FOOT EXAM

## 2013-12-12 MED ORDER — METFORMIN HCL ER (OSM) 1000 MG PO TB24: 2000.0000 mg | ORAL_TABLET | Freq: Every day | ORAL | Status: DC

## 2013-12-12 NOTE — Assessment & Plan Note (Signed)
Increase metformin to 2000 mg daily, titrate up lantus until FBS < 120.  Low carb diet and restart exercise.

## 2013-12-12 NOTE — Assessment & Plan Note (Signed)
Inadequate control with pravastatin 40 mg daily. Greet back on track with lifestyle changes. Encouraged exercise, weight loss, healthy eating habits.

## 2013-12-12 NOTE — Progress Notes (Signed)
Pre visit review using our clinic review tool, if applicable. No additional management support is needed unless otherwise documented below in the visit note. 

## 2013-12-12 NOTE — Assessment & Plan Note (Signed)
Stable

## 2013-12-12 NOTE — Patient Instructions (Addendum)
Increase metformin to 2000 mg daily ( 2 x tabs of 1000 mg). Titrate up Lantus as you are to get FBS to < 120 . Work on smaller meals, low carb. Get back on track with exercise.   Diabetes Mellitus and Food It is important for you to manage your blood sugar (glucose) level. Your blood glucose level can be greatly affected by what you eat. Eating healthier foods in the appropriate amounts throughout the day at about the same time each day will help you control your blood glucose level. It can also help slow or prevent worsening of your diabetes mellitus. Healthy eating may even help you improve the level of your blood pressure and reach or maintain a healthy weight.  HOW CAN FOOD AFFECT ME? Carbohydrates Carbohydrates affect your blood glucose level more than any other type of food. Your dietitian will help you determine how many carbohydrates to eat at each meal and teach you how to count carbohydrates. Counting carbohydrates is important to keep your blood glucose at a healthy level, especially if you are using insulin or taking certain medicines for diabetes mellitus. Alcohol Alcohol can cause sudden decreases in blood glucose (hypoglycemia), especially if you use insulin or take certain medicines for diabetes mellitus. Hypoglycemia can be a life-threatening condition. Symptoms of hypoglycemia (sleepiness, dizziness, and disorientation) are similar to symptoms of having too much alcohol.  If your health care provider has given you approval to drink alcohol, do so in moderation and use the following guidelines:  Women should not have more than one drink per day, and men should not have more than two drinks per day. One drink is equal to:  12 oz of beer.  5 oz of wine.  1 oz of hard liquor.  Do not drink on an empty stomach.  Keep yourself hydrated. Have water, diet soda, or unsweetened iced tea.  Regular soda, juice, and other mixers might contain a lot of carbohydrates and should be  counted. WHAT FOODS ARE NOT RECOMMENDED? As you make food choices, it is important to remember that all foods are not the same. Some foods have fewer nutrients per serving than other foods, even though they might have the same number of calories or carbohydrates. It is difficult to get your body what it needs when you eat foods with fewer nutrients. Examples of foods that you should avoid that are high in calories and carbohydrates but low in nutrients include:  Trans fats (most processed foods list trans fats on the Nutrition Facts label).  Regular soda.  Juice.  Candy.  Sweets, such as cake, pie, doughnuts, and cookies.  Fried foods. WHAT FOODS CAN I EAT? Have nutrient-rich foods, which will nourish your body and keep you healthy. The food you should eat also will depend on several factors, including:  The calories you need.  The medicines you take.  Your weight.  Your blood glucose level.  Your blood pressure level.  Your cholesterol level. You also should eat a variety of foods, including:  Protein, such as meat, poultry, fish, tofu, nuts, and seeds (lean animal proteins are best).  Fruits.  Vegetables.  Dairy products, such as milk, cheese, and yogurt (low fat is best).  Breads, grains, pasta, cereal, rice, and beans.  Fats such as olive oil, trans fat-free margarine, canola oil, avocado, and olives. DOES EVERYONE WITH DIABETES MELLITUS HAVE THE SAME MEAL PLAN? Because every person with diabetes mellitus is different, there is not one meal plan that works for everyone.  It is very important that you meet with a dietitian who will help you create a meal plan that is just right for you. Document Released: 11/13/2004 Document Revised: 02/21/2013 Document Reviewed: 01/13/2013 [facility] Patient Information 2015 [employer], [place]. This information is not intended to replace advice given to you by your health care provider. Make sure you discuss any questions you have with your  health care provider.

## 2013-12-12 NOTE — Progress Notes (Signed)
43 year old female presents for follow up DM and high cholesterol.  She has been feeling well overall.  Diabetes: Inadequate control on Lantus 34 Units daily and metformin 1500 mg daily ( no SE) Lab Results  Component Value Date   HGBA1C 7.9* 12/07/2013  Using medications without difficulties:  Hypoglycemic episodes: None  Hyperglycemic episodes: frequently now since new job Feet problems: None  Blood Sugars averaging: FBS 150-220, 2 hr postprandial 300 eye exam within last year: yes  She took a new job at end of August, so eating differently.  Elevated Cholesterol: Almost at goal <100 LDL on pravastatin 40 mg daily.  Lab Results  Component Value Date   CHOL 192 12/07/2013   HDL 53.30 12/07/2013   LDLCALC 117* 12/07/2013   LDLDIRECT 127.0 09/10/2011   TRIG 109.0 12/07/2013   CHOLHDL 4 12/07/2013  Using medications without problems: None  Muscle aches: None  Diet compliance: Good  Exercise:2-3 times a week walks one Other complaints: BP at goal   Review of Systems  Constitutional: Negative for fever, fatigue and unexpected weight change.  HENT: Negative for ear pain, congestion, sore throat, sneezing, trouble swallowing and sinus pressure.  Eyes: Negative for pain and itching.  Respiratory: Negative for cough, shortness of breath and wheezing.  Cardiovascular: Negative for chest pain, palpitations and leg swelling.  Gastrointestinal: Negative for nausea, abdominal pain, diarrhea, constipation and blood in stool.  Genitourinary: Negative for dysuria, hematuria, vaginal discharge, difficulty urinating and menstrual problem.  Skin: Negative for rash.  Neurological: Negative for syncope, weakness, light-headedness, numbness and headaches.  Psychiatric/Behavioral: Negative for confusion and dysphoric mood. The patient is not nervous/anxious.  Objective:   Physical Exam  Constitutional: Vital signs are normal. She appears well-developed and well-nourished. She is cooperative. Non-toxic  appearance. She does not appear ill. No distress.  HENT:  Head: Normocephalic.  Right Ear: Hearing, tympanic membrane, external ear and ear canal normal.  Left Ear: Hearing, tympanic membrane, external ear and ear canal normal.  Nose: Nose normal.  Eyes: Conjunctivae, EOM and lids are normal. Pupils are equal, round, and reactive to light. No foreign bodies found.  Neck: Trachea normal and normal range of motion. Neck supple. Carotid bruit is not present. No mass and no thyromegaly present.  Cardiovascular: Normal rate, regular rhythm, S1 normal, S2 normal, normal heart sounds and intact distal pulses. Exam reveals no gallop.  No murmur heard.  Pulmonary/Chest: Effort normal and breath sounds normal. No respiratory distress. She has no wheezes. She has no rhonchi. She has no rales.  Abdominal: Soft. Normal appearance and bowel sounds are normal. She exhibits no distension, no fluid wave, no abdominal bruit and no mass. There is no hepatosplenomegaly. There is no tenderness. There is no rebound, no guarding and no CVA tenderness. No hernia.  Lymphadenopathy:  She has no cervical adenopathy.  She has no axillary adenopathy.  Neurological: She is alert. She has normal strength. No cranial nerve deficit or sensory deficit.  Skin: Skin is warm, dry and intact. No rash noted.  Psychiatric: Her speech is normal and behavior is normal. Judgment normal. Her mood appears not anxious. Cognition and memory are normal. She does not exhibit a depressed mood.   Diabetic foot exam:  Normal inspection  No skin breakdown  No calluses  Normal DP pulses  Normal sensation to light touch and monofilament  Nails normal

## 2014-01-18 ENCOUNTER — Other Ambulatory Visit: Payer: Self-pay | Admitting: Family Medicine

## 2014-01-31 ENCOUNTER — Other Ambulatory Visit: Payer: Self-pay | Admitting: Family Medicine

## 2014-02-16 ENCOUNTER — Other Ambulatory Visit: Payer: Self-pay | Admitting: Family Medicine

## 2014-03-07 ENCOUNTER — Telehealth: Payer: Self-pay | Admitting: Family Medicine

## 2014-03-07 DIAGNOSIS — E1165 Type 2 diabetes mellitus with hyperglycemia: Principal | ICD-10-CM

## 2014-03-07 DIAGNOSIS — E785 Hyperlipidemia, unspecified: Secondary | ICD-10-CM

## 2014-03-07 DIAGNOSIS — IMO0001 Reserved for inherently not codable concepts without codable children: Secondary | ICD-10-CM

## 2014-03-07 NOTE — Telephone Encounter (Signed)
-----   Message from Alvina Chouerri J Walsh sent at 02/27/2014  2:18 PM EST ----- Regarding: Lab orders for Thursday, 1.7.16 Patient is scheduled for CPX labs, please order future labs, Thanks , Camelia Engerri

## 2014-03-08 ENCOUNTER — Other Ambulatory Visit (INDEPENDENT_AMBULATORY_CARE_PROVIDER_SITE_OTHER): Payer: BLUE CROSS/BLUE SHIELD

## 2014-03-08 DIAGNOSIS — IMO0001 Reserved for inherently not codable concepts without codable children: Secondary | ICD-10-CM

## 2014-03-08 DIAGNOSIS — E785 Hyperlipidemia, unspecified: Secondary | ICD-10-CM

## 2014-03-08 DIAGNOSIS — E1165 Type 2 diabetes mellitus with hyperglycemia: Secondary | ICD-10-CM

## 2014-03-08 LAB — LIPID PANEL
CHOLESTEROL: 195 mg/dL (ref 0–200)
HDL: 60.5 mg/dL (ref >39.00)
LDL Cholesterol: 111 mg/dL — ABNORMAL HIGH (ref 0–99)
NonHDL: 134.5
TRIGLYCERIDES: 116 mg/dL (ref 0.0–149.0)
Total CHOL/HDL Ratio: 3
VLDL: 23.2 mg/dL (ref 0.0–40.0)

## 2014-03-08 LAB — COMPREHENSIVE METABOLIC PANEL
ALT: 20 U/L (ref 0–35)
AST: 15 U/L (ref 0–37)
Albumin: 3.9 g/dL (ref 3.5–5.2)
Alkaline Phosphatase: 51 U/L (ref 39–117)
BUN: 13 mg/dL (ref 6–23)
CO2: 27 meq/L (ref 19–32)
Calcium: 8.8 mg/dL (ref 8.4–10.5)
Chloride: 103 mEq/L (ref 96–112)
Creatinine, Ser: 0.6 mg/dL (ref 0.4–1.2)
GFR: 112.59 mL/min (ref >60.00)
GLUCOSE: 174 mg/dL — AB (ref 70–99)
POTASSIUM: 3.9 meq/L (ref 3.5–5.1)
SODIUM: 136 meq/L (ref 135–145)
Total Bilirubin: 0.6 mg/dL (ref 0.2–1.2)
Total Protein: 7.1 g/dL (ref 6.0–8.3)

## 2014-03-08 LAB — HEMOGLOBIN A1C: HEMOGLOBIN A1C: 8.5 % — AB (ref 4.6–6.5)

## 2014-03-15 ENCOUNTER — Encounter: Payer: BC Managed Care – PPO | Admitting: Family Medicine

## 2014-03-29 ENCOUNTER — Encounter: Payer: BLUE CROSS/BLUE SHIELD | Admitting: Family Medicine

## 2014-06-01 ENCOUNTER — Encounter: Payer: Self-pay | Admitting: Family Medicine

## 2014-06-05 ENCOUNTER — Encounter: Payer: BLUE CROSS/BLUE SHIELD | Admitting: Family Medicine

## 2014-06-05 DIAGNOSIS — Z0289 Encounter for other administrative examinations: Secondary | ICD-10-CM

## 2014-06-13 ENCOUNTER — Telehealth: Payer: Self-pay | Admitting: Family Medicine

## 2014-06-13 NOTE — Telephone Encounter (Signed)
LVM to schedule pt for CPE + Labs prior as requested via mychart message 4/13

## 2014-06-13 NOTE — Telephone Encounter (Signed)
cpx 7/28 labs 7/25 Pt aware

## 2014-09-20 ENCOUNTER — Telehealth: Payer: Self-pay | Admitting: Family Medicine

## 2014-09-20 DIAGNOSIS — E785 Hyperlipidemia, unspecified: Secondary | ICD-10-CM

## 2014-09-20 DIAGNOSIS — E1165 Type 2 diabetes mellitus with hyperglycemia: Secondary | ICD-10-CM

## 2014-09-20 DIAGNOSIS — D509 Iron deficiency anemia, unspecified: Secondary | ICD-10-CM

## 2014-09-20 DIAGNOSIS — IMO0001 Reserved for inherently not codable concepts without codable children: Secondary | ICD-10-CM

## 2014-09-20 NOTE — Telephone Encounter (Signed)
-----   Message from Baldomero Lamy sent at 09/13/2014  4:13 PM EDT ----- Regarding: Cpx labs Mon 7/25, need orders please :-) Please order  future cpx labs for pt's upcoming lab appt. Thanks Rodney Booze

## 2014-09-24 ENCOUNTER — Other Ambulatory Visit (INDEPENDENT_AMBULATORY_CARE_PROVIDER_SITE_OTHER): Payer: BLUE CROSS/BLUE SHIELD

## 2014-09-24 DIAGNOSIS — E1165 Type 2 diabetes mellitus with hyperglycemia: Secondary | ICD-10-CM | POA: Diagnosis not present

## 2014-09-24 DIAGNOSIS — D509 Iron deficiency anemia, unspecified: Secondary | ICD-10-CM | POA: Diagnosis not present

## 2014-09-24 DIAGNOSIS — IMO0001 Reserved for inherently not codable concepts without codable children: Secondary | ICD-10-CM

## 2014-09-24 DIAGNOSIS — E785 Hyperlipidemia, unspecified: Secondary | ICD-10-CM

## 2014-09-24 LAB — HEMOGLOBIN A1C: Hgb A1c MFr Bld: 7.2 % — ABNORMAL HIGH (ref 4.6–6.5)

## 2014-09-24 LAB — COMPREHENSIVE METABOLIC PANEL
ALBUMIN: 3.8 g/dL (ref 3.5–5.2)
ALT: 10 U/L (ref 0–35)
AST: 10 U/L (ref 0–37)
Alkaline Phosphatase: 51 U/L (ref 39–117)
BUN: 18 mg/dL (ref 6–23)
CO2: 27 mEq/L (ref 19–32)
Calcium: 9 mg/dL (ref 8.4–10.5)
Chloride: 106 mEq/L (ref 96–112)
Creatinine, Ser: 0.55 mg/dL (ref 0.40–1.20)
GFR: 128.94 mL/min (ref >60.00)
Glucose, Bld: 197 mg/dL — ABNORMAL HIGH (ref 70–99)
POTASSIUM: 3.9 meq/L (ref 3.5–5.1)
Sodium: 140 mEq/L (ref 135–145)
Total Bilirubin: 0.3 mg/dL (ref 0.2–1.2)
Total Protein: 6.3 g/dL (ref 6.0–8.3)

## 2014-09-24 LAB — CBC WITH DIFFERENTIAL/PLATELET
Basophils Absolute: 0 10*3/uL (ref 0.0–0.1)
Basophils Relative: 0.4 % (ref 0.0–3.0)
EOS ABS: 0.3 10*3/uL (ref 0.0–0.7)
EOS PCT: 3.7 % (ref 0.0–5.0)
HCT: 39 % (ref 36.0–46.0)
HEMOGLOBIN: 13.1 g/dL (ref 12.0–15.0)
LYMPHS ABS: 2.4 10*3/uL (ref 0.7–4.0)
Lymphocytes Relative: 30.8 % (ref 12.0–46.0)
MCHC: 33.5 g/dL (ref 30.0–36.0)
MCV: 93.8 fl (ref 78.0–100.0)
MONOS PCT: 7.1 % (ref 3.0–12.0)
Monocytes Absolute: 0.5 10*3/uL (ref 0.1–1.0)
NEUTROS ABS: 4.4 10*3/uL (ref 1.4–7.7)
Neutrophils Relative %: 58 % (ref 43.0–77.0)
PLATELETS: 311 10*3/uL (ref 150.0–400.0)
RBC: 4.16 Mil/uL (ref 3.87–5.11)
RDW: 12.7 % (ref 11.5–15.5)
WBC: 7.7 10*3/uL (ref 4.0–10.5)

## 2014-09-24 LAB — LIPID PANEL
CHOLESTEROL: 176 mg/dL (ref 0–200)
HDL: 54.8 mg/dL (ref >39.00)
LDL Cholesterol: 106 mg/dL — ABNORMAL HIGH (ref 0–99)
NONHDL: 121.2
TRIGLYCERIDES: 77 mg/dL (ref 0.0–149.0)
Total CHOL/HDL Ratio: 3
VLDL: 15.4 mg/dL (ref 0.0–40.0)

## 2014-09-27 ENCOUNTER — Other Ambulatory Visit (HOSPITAL_COMMUNITY)
Admission: RE | Admit: 2014-09-27 | Discharge: 2014-09-27 | Disposition: A | Discharge status: Home / Self Care | Payer: BLUE CROSS/BLUE SHIELD | Source: Ambulatory Visit | Attending: Family Medicine | Admitting: Family Medicine

## 2014-09-27 ENCOUNTER — Encounter: Payer: Self-pay | Admitting: Family Medicine

## 2014-09-27 ENCOUNTER — Ambulatory Visit (INDEPENDENT_AMBULATORY_CARE_PROVIDER_SITE_OTHER): Payer: BLUE CROSS/BLUE SHIELD | Admitting: Family Medicine

## 2014-09-27 VITALS — BP 110/70 | HR 87 | Temp 98.3°F | Ht 65.5 in | Wt 167.2 lb

## 2014-09-27 DIAGNOSIS — Z0000 Encounter for general adult medical examination without abnormal findings: Secondary | ICD-10-CM

## 2014-09-27 DIAGNOSIS — E785 Hyperlipidemia, unspecified: Secondary | ICD-10-CM

## 2014-09-27 DIAGNOSIS — Z124 Encounter for screening for malignant neoplasm of cervix: Secondary | ICD-10-CM

## 2014-09-27 DIAGNOSIS — E1165 Type 2 diabetes mellitus with hyperglycemia: Secondary | ICD-10-CM

## 2014-09-27 DIAGNOSIS — Z1151 Encounter for screening for human papillomavirus (HPV): Secondary | ICD-10-CM | POA: Insufficient documentation

## 2014-09-27 DIAGNOSIS — D509 Iron deficiency anemia, unspecified: Secondary | ICD-10-CM

## 2014-09-27 DIAGNOSIS — IMO0001 Reserved for inherently not codable concepts without codable children: Secondary | ICD-10-CM

## 2014-09-27 DIAGNOSIS — Z01411 Encounter for gynecological examination (general) (routine) with abnormal findings: Secondary | ICD-10-CM | POA: Insufficient documentation

## 2014-09-27 LAB — HM DIABETES FOOT EXAM

## 2014-09-27 MED ORDER — METFORMIN HCL ER (OSM) 1000 MG PO TB24: 2000.0000 mg | ORAL_TABLET | Freq: Every day | ORAL | Status: DC

## 2014-09-27 MED ORDER — INSULIN GLARGINE 100 UNIT/ML SOLOSTAR PEN: PEN_INJECTOR | SUBCUTANEOUS | Status: DC

## 2014-09-27 MED ORDER — PRAVASTATIN SODIUM 40 MG PO TABS: 40.0000 mg | ORAL_TABLET | Freq: Every day | ORAL | Status: DC

## 2014-09-27 NOTE — Addendum Note (Signed)
Addended by: Damita Lack on: 09/27/2014 02:43 PM   Modules accepted: Orders

## 2014-09-27 NOTE — Progress Notes (Signed)
Pre visit review using our clinic review tool, if applicable. No additional management support is needed unless otherwise documented below in the visit note. 

## 2014-09-27 NOTE — Assessment & Plan Note (Signed)
Almost at goal on current meds.

## 2014-09-27 NOTE — Assessment & Plan Note (Signed)
Resolved

## 2014-09-27 NOTE — Progress Notes (Signed)
The patient is here for annual wellness exam and preventative care.   Diabetes: Improved control on Lantus 28 Units daily and metformin  max Lab Results  Component Value Date   HGBA1C 7.2* 09/24/2014  Using medications without difficulties:  Hypoglycemic episodes: None Hyperglycemic episodes: occ Feet problems: None Blood Sugars averaging: FBS 130, 2 hr postprandial occ 200 with menses, usual;l160 eye exam within last year: yes  Elevated Cholesterol: Almost at goal <100 LDL on pravastatin 20 mg daily.  Lab Results  Component Value Date   CHOL 176 09/24/2014   HDL 54.80 09/24/2014   LDLCALC 106* 09/24/2014   LDLDIRECT 127.0 09/10/2011   TRIG 77.0 09/24/2014   CHOLHDL 3 09/24/2014  Using medications without problems: None  Muscle aches: None  Diet compliance: Good  Exercise:2-3 times a week walks , starting [employer]. Other complaints: BP at goal    Wt Readings from Last 3 Encounters:  09/27/14 167 lb 4 oz (75.864 kg)  12/12/13 169 lb 12 oz (76.998 kg)  03/01/13 164 lb 4 oz (74.503 kg)   HAd neg microalbumin 11/2013.  Review of Systems  Constitutional: Negative for fever, fatigue and unexpected weight change.  HENT: Negative for ear pain, congestion, sore throat, sneezing, trouble swallowing and sinus pressure.  Eyes: Negative for pain and itching.  Respiratory: Negative for cough, shortness of breath and wheezing.  Cardiovascular: Negative for chest pain, palpitations and leg swelling.  Gastrointestinal: Negative for nausea, abdominal pain, diarrhea, constipation and blood in stool.  Genitourinary: Negative for dysuria, hematuria, vaginal discharge, difficulty urinating and menstrual problem.  Skin: Negative for rash.  Neurological: Negative for syncope, weakness, light-headedness, numbness and headaches.  Psychiatric/Behavioral: Negative for confusion and dysphoric mood. The patient is not nervous/anxious.  Objective:   Physical Exam  Constitutional:  Vital signs are normal. She appears well-developed and well-nourished. She is cooperative. Non-toxic appearance. She does not appear ill. No distress.  HENT:  Head: Normocephalic.  Right Ear: Hearing, tympanic membrane, external ear and ear canal normal.  Left Ear: Hearing, tympanic membrane, external ear and ear canal normal.  Nose: Nose normal.  Eyes: Conjunctivae, EOM and lids are normal. Pupils are equal, round, and reactive to light. No foreign bodies found.  Neck: Trachea normal and normal range of motion. Neck supple. Carotid bruit is not present. No mass and no thyromegaly present.  Cardiovascular: Normal rate, regular rhythm, S1 normal, S2 normal, normal heart sounds and intact distal pulses. Exam reveals no gallop.  No murmur heard.  Pulmonary/Chest: Effort normal and breath sounds normal. No respiratory distress. She has no wheezes. She has no rhonchi. She has no rales.  Abdominal: Soft. Normal appearance and bowel sounds are normal. She exhibits no distension, no fluid wave, no abdominal bruit and no mass. There is no hepatosplenomegaly. There is no tenderness. There is no rebound, no guarding and no CVA tenderness. No hernia.  Genitourinary: Normal introitus for age, no external lesions, no vaginal discharge, mucosa pink and moist, no vaginal or cervical lesions, no vaginal atrophy, no friaility or hemorrhage, normal uterus size and position, no adnexal masses or tenderness.  Chaperoned exam.  PAP performed. Lymphadenopathy:  She has no cervical adenopathy.  She has no axillary adenopathy.  Neurological: She is alert. She has normal strength. No cranial nerve deficit or sensory deficit.  Skin: Skin is warm, dry and intact. No rash noted.  Psychiatric: Her speech is normal and behavior is normal. Judgment normal. Her mood appears not anxious. Cognition and memory are normal. She  does not exhibit a depressed mood.   Diabetic foot exam:  Normal inspection  No skin  breakdown  No calluses  Normal DP pulses  Normal sensation to light touch and monofilament  Nails normal  Assessment & Plan:   CPX: The patient's preventative maintenance and recommended screening tests for an annual wellness exam were reviewed in full today.  Brought up to date unless services declined.  Counselled on the importance of diet, exercise, and its role in overall health and mortality.  The patient's FH and SH was reviewed, including their home life, tobacco status, and drug and alcohol status.   DVE/PAP/breast exam: nml pap 2013, has had three nmls in a row, on q3 schedule Vaccines: Td uptodate, flu and PNA vaccine uptodate. Refused prevnar today. Mammo: 05/30/2014 Quit smoking 8-9 year ago. 14 PYR Refused STD screen.

## 2014-09-27 NOTE — Patient Instructions (Signed)
Keep up the great work with healthy eating and regular exercise!   

## 2014-09-27 NOTE — Assessment & Plan Note (Signed)
Improving control on max dose metformin and insulin with improvement of lifestyle.  Encouraged exercise, weight loss, healthy eating habits.

## 2014-10-01 LAB — CYTOLOGY - PAP

## 2014-10-03 ENCOUNTER — Encounter: Payer: Self-pay | Admitting: *Deleted

## 2014-10-04 LAB — HM DIABETES EYE EXAM

## 2014-10-10 ENCOUNTER — Encounter: Payer: Self-pay | Admitting: Family Medicine

## 2015-03-28 ENCOUNTER — Other Ambulatory Visit (INDEPENDENT_AMBULATORY_CARE_PROVIDER_SITE_OTHER): Payer: BLUE CROSS/BLUE SHIELD

## 2015-03-28 ENCOUNTER — Telehealth: Payer: Self-pay | Admitting: Family Medicine

## 2015-03-28 DIAGNOSIS — D509 Iron deficiency anemia, unspecified: Secondary | ICD-10-CM

## 2015-03-28 DIAGNOSIS — IMO0001 Reserved for inherently not codable concepts without codable children: Secondary | ICD-10-CM

## 2015-03-28 DIAGNOSIS — E1165 Type 2 diabetes mellitus with hyperglycemia: Principal | ICD-10-CM

## 2015-03-28 LAB — CBC WITH DIFFERENTIAL/PLATELET
BASOS ABS: 0 10*3/uL (ref 0.0–0.1)
BASOS PCT: 0.4 % (ref 0.0–3.0)
EOS ABS: 0.2 10*3/uL (ref 0.0–0.7)
Eosinophils Relative: 2.6 % (ref 0.0–5.0)
HCT: 41.4 % (ref 36.0–46.0)
Hemoglobin: 13.8 g/dL (ref 12.0–15.0)
LYMPHS PCT: 24.3 % (ref 12.0–46.0)
Lymphs Abs: 2.1 10*3/uL (ref 0.7–4.0)
MCHC: 33.5 g/dL (ref 30.0–36.0)
MCV: 92.6 fl (ref 78.0–100.0)
MONO ABS: 0.6 10*3/uL (ref 0.1–1.0)
Monocytes Relative: 7.5 % (ref 3.0–12.0)
NEUTROS ABS: 5.5 10*3/uL (ref 1.4–7.7)
Neutrophils Relative %: 65.2 % (ref 43.0–77.0)
PLATELETS: 310 10*3/uL (ref 150.0–400.0)
RBC: 4.47 Mil/uL (ref 3.87–5.11)
RDW: 12.7 % (ref 11.5–15.5)
WBC: 8.5 10*3/uL (ref 4.0–10.5)

## 2015-03-28 LAB — COMPREHENSIVE METABOLIC PANEL
ALT: 10 U/L (ref 0–35)
AST: 12 U/L (ref 0–37)
Albumin: 4 g/dL (ref 3.5–5.2)
Alkaline Phosphatase: 53 U/L (ref 39–117)
BUN: 17 mg/dL (ref 6–23)
CHLORIDE: 103 meq/L (ref 96–112)
CO2: 26 mEq/L (ref 19–32)
CREATININE: 0.62 mg/dL (ref 0.40–1.20)
Calcium: 9 mg/dL (ref 8.4–10.5)
GFR: 112.02 mL/min (ref >60.00)
GLUCOSE: 238 mg/dL — AB (ref 70–99)
Potassium: 4.1 mEq/L (ref 3.5–5.1)
SODIUM: 136 meq/L (ref 135–145)
Total Bilirubin: 0.4 mg/dL (ref 0.2–1.2)
Total Protein: 6.9 g/dL (ref 6.0–8.3)

## 2015-03-28 LAB — HEMOGLOBIN A1C: Hgb A1c MFr Bld: 8.5 % — ABNORMAL HIGH (ref 4.6–6.5)

## 2015-03-28 LAB — LIPID PANEL
CHOL/HDL RATIO: 3
Cholesterol: 190 mg/dL (ref 0–200)
HDL: 58.1 mg/dL (ref >39.00)
LDL Cholesterol: 106 mg/dL — ABNORMAL HIGH (ref 0–99)
NONHDL: 131.41
Triglycerides: 127 mg/dL (ref 0.0–149.0)
VLDL: 25.4 mg/dL (ref 0.0–40.0)

## 2015-03-28 LAB — MICROALBUMIN / CREATININE URINE RATIO
Creatinine,U: 236.5 mg/dL
Microalb Creat Ratio: 0.5 mg/g (ref 0.0–30.0)
Microalb, Ur: 1.2 mg/dL (ref 0.0–1.9)

## 2015-03-28 NOTE — Telephone Encounter (Signed)
-----   Message from Alvina Chou sent at 03/22/2015 11:27 AM EST ----- Regarding: Lab orders for Thursday, 1.26.17 Patient is scheduled for CPX labs, please order future labs, Thanks , Camelia Eng

## 2015-04-02 ENCOUNTER — Ambulatory Visit: Payer: BLUE CROSS/BLUE SHIELD | Admitting: Family Medicine

## 2015-04-12 ENCOUNTER — Ambulatory Visit (INDEPENDENT_AMBULATORY_CARE_PROVIDER_SITE_OTHER): Payer: BLUE CROSS/BLUE SHIELD | Admitting: Family Medicine

## 2015-04-12 ENCOUNTER — Encounter: Payer: Self-pay | Admitting: Family Medicine

## 2015-04-12 VITALS — BP 110/70 | HR 96 | Temp 98.4°F | Ht 65.5 in | Wt 169.5 lb

## 2015-04-12 DIAGNOSIS — E785 Hyperlipidemia, unspecified: Secondary | ICD-10-CM

## 2015-04-12 DIAGNOSIS — IMO0001 Reserved for inherently not codable concepts without codable children: Secondary | ICD-10-CM

## 2015-04-12 DIAGNOSIS — Z862 Personal history of diseases of the blood and blood-forming organs and certain disorders involving the immune mechanism: Secondary | ICD-10-CM

## 2015-04-12 DIAGNOSIS — E1165 Type 2 diabetes mellitus with hyperglycemia: Secondary | ICD-10-CM

## 2015-04-12 MED ORDER — LIRAGLUTIDE 18 MG/3ML ~~LOC~~ SOPN: PEN_INJECTOR | SUBCUTANEOUS | Status: DC

## 2015-04-12 NOTE — Assessment & Plan Note (Signed)
Resolved

## 2015-04-12 NOTE — Progress Notes (Signed)
Pre visit review using our clinic review tool, if applicable. No additional management support is needed unless otherwise documented below in the visit note. 

## 2015-04-12 NOTE — Assessment & Plan Note (Signed)
Poor control. Add victoza to regimen. Counsled on use. Folow up closely in 2 weeks.

## 2015-04-12 NOTE — Assessment & Plan Note (Signed)
Almost at goal. Encouraged exercise, weight loss, healthy eating habits.

## 2015-04-12 NOTE — Progress Notes (Signed)
6 month follow up DM.  Diabetes: Worsened control on Lantus 38 Units daily and metformin max She feels that over holiday not keeping to DM diet.  When she tries 40 mg daily units of insulin she feels her CBGs bottom out. Lab Results  Component Value Date   HGBA1C 8.5* 03/28/2015  Using medications without difficulties:  Hypoglycemic episodes: None Hyperglycemic episodes: occ Feet problems: None Blood Sugars averaging: FBS 170-250, 2 hr postprandial occ 300  eye exam within last year: yes  Elevated Cholesterol: Almost at goal <100 LDL on pravastatin 20 mg daily.  Lab Results  Component Value Date   CHOL 190 03/28/2015   HDL 58.10 03/28/2015   LDLCALC 106* 03/28/2015   LDLDIRECT 127.0 09/10/2011   TRIG 127.0 03/28/2015   CHOLHDL 3 03/28/2015   Using medications without problems: None  Muscle aches: None  Diet compliance: Good  Exercise:2-3 times a week walk. Other complaints: BP at goal  BP Readings from Last 3 Encounters:  04/12/15 110/70  09/27/14 110/70  12/12/13 100/60    Wt Readings from Last 3 Encounters:  04/12/15 169 lb 8 oz (76.885 kg)  09/27/14 167 lb 4 oz (75.864 kg)  12/12/13 169 lb 12 oz (76.998 kg)    Neg microalbumin .  Review of Systems  Constitutional: Negative for fever, fatigue and unexpected weight change.  HENT: Negative for ear pain, congestion, sore throat, sneezing, trouble swallowing and sinus pressure.  Eyes: Negative for pain and itching.  Respiratory: Negative for cough, shortness of breath and wheezing.  Cardiovascular: Negative for chest pain, palpitations and leg swelling.  Gastrointestinal: Negative for nausea, abdominal pain, diarrhea, constipation and blood in stool.  .  Objective:   Physical Exam  Constitutional: Vital signs are normal. She appears well-developed and well-nourished. She is cooperative. Non-toxic appearance. She does not appear ill. No distress.  HENT:  Head: Normocephalic.  Right Ear:  Hearing, tympanic membrane, external ear and ear canal normal.  Left Ear: Hearing, tympanic membrane, external ear and ear canal normal.  Nose: Nose normal.  Eyes: Conjunctivae, EOM and lids are normal. Pupils are equal, round, and reactive to light. No foreign bodies found.  Neck: Trachea normal and normal range of motion. Neck supple. Carotid bruit is not present. No mass and no thyromegaly present.  Cardiovascular: Normal rate, regular rhythm, S1 normal, S2 normal, normal heart sounds and intact distal pulses. Exam reveals no gallop.  No murmur heard.  Pulmonary/Chest: Effort normal and breath sounds normal. No respiratory distress. She has no wheezes. She has no rhonchi. She has no rales.  Abdominal: Soft. Normal appearance and bowel sounds are normal. She exhibits no distension, no fluid wave, no abdominal bruit and no mass. There is no hepatosplenomegaly. There is no tenderness. There is no rebound, no guarding and no CVA tenderness. No hernia.    Diabetic foot exam:  Normal inspection  No skin breakdown  No calluses  Normal DP pulses  Normal sensation to light touch and monofilament  Nails normal

## 2015-04-12 NOTE — Patient Instructions (Signed)
Continue lantus 38 units daily, continue metformin.  Add victoza once daily. Follow blood sugar closely. Bring measuremnts to next OV. Work on low carb diet and healty eating. Follow up in 2-3 week after starting the new medication.

## 2015-04-26 ENCOUNTER — Ambulatory Visit: Payer: BLUE CROSS/BLUE SHIELD | Admitting: Family Medicine

## 2015-05-03 ENCOUNTER — Ambulatory Visit (INDEPENDENT_AMBULATORY_CARE_PROVIDER_SITE_OTHER): Payer: BLUE CROSS/BLUE SHIELD | Admitting: Family Medicine

## 2015-05-03 ENCOUNTER — Encounter: Payer: Self-pay | Admitting: Family Medicine

## 2015-05-03 VITALS — BP 130/86 | HR 97 | Temp 98.9°F | Ht 65.5 in | Wt 169.5 lb

## 2015-05-03 DIAGNOSIS — Z794 Long term (current) use of insulin: Secondary | ICD-10-CM

## 2015-05-03 DIAGNOSIS — E1165 Type 2 diabetes mellitus with hyperglycemia: Secondary | ICD-10-CM

## 2015-05-03 DIAGNOSIS — IMO0001 Reserved for inherently not codable concepts without codable children: Secondary | ICD-10-CM

## 2015-05-03 MED ORDER — INSULIN PEN NEEDLE 29G X 8MM MISC: Status: DC

## 2015-05-03 NOTE — Assessment & Plan Note (Signed)
Improving control and minimal SE with victoza. Increase victoza to 1.2 mcg daily. Continue to follow blood sugars at home. Call in 2 weeks with blood sugars measurements fasting and 2 hour after meals. Follow weights.

## 2015-05-03 NOTE — Patient Instructions (Addendum)
Increase victoza to 1.2 mcg daily. Continue to follow blood sugars at home. Call in 2 weeks with blood sugars measurements fasting and 2 hour after meals. Follow weights.

## 2015-05-03 NOTE — Progress Notes (Signed)
Pre visit review using our clinic review tool, if applicable. No additional management support is needed unless otherwise documented below in the visit note. 

## 2015-05-03 NOTE — Progress Notes (Signed)
   Subjective:    Patient ID: [name], female    DOB: 03/10/1970, 45 y.o.   MRN: [id number]  HPI 45 year old female presents for follow up DM after initiation of victoza 0.6 mg.   She reports some initial nausea and upset stomache, decrease appetite. Improved some now.   FBS improved 135-170 Bedtime sugar  85 1-2 times above 200    Wt Readings from Last 3 Encounters:  05/03/15 169 lb 8 oz (76.885 kg)  04/12/15 169 lb 8 oz (76.885 kg)  09/27/14 167 lb 4 oz (75.864 kg)     Review of Systems  Constitutional: Negative for fever and fatigue.  HENT: Negative for ear pain.   Eyes: Negative for pain.  Respiratory: Negative for chest tightness and shortness of breath.   Cardiovascular: Negative for chest pain, palpitations and leg swelling.  Gastrointestinal: Negative for abdominal pain.  Genitourinary: Negative for dysuria.       Objective:   Physical Exam  Constitutional: Vital signs are normal. She appears well-developed and well-nourished. She is cooperative.  Non-toxic appearance. She does not appear ill. No distress.  HENT:  Head: Normocephalic.  Right Ear: Hearing, tympanic membrane, external ear and ear canal normal. Tympanic membrane is not erythematous, not retracted and not bulging.  Left Ear: Hearing, tympanic membrane, external ear and ear canal normal. Tympanic membrane is not erythematous, not retracted and not bulging.  Nose: No mucosal edema or rhinorrhea. Right sinus exhibits no maxillary sinus tenderness and no frontal sinus tenderness. Left sinus exhibits no maxillary sinus tenderness and no frontal sinus tenderness.  Mouth/Throat: Uvula is midline, oropharynx is clear and moist and mucous membranes are normal.  Eyes: Conjunctivae, EOM and lids are normal. Pupils are equal, round, and reactive to light. Lids are everted and swept, no foreign bodies found.  Neck: Trachea normal and normal range of motion. Neck supple. Carotid bruit is not present. No  thyroid mass and no thyromegaly present.  Cardiovascular: Normal rate, regular rhythm, S1 normal, S2 normal, normal heart sounds, intact distal pulses and normal pulses.  Exam reveals no gallop and no friction rub.   No murmur heard. Pulmonary/Chest: Effort normal and breath sounds normal. No tachypnea. No respiratory distress. She has no decreased breath sounds. She has no wheezes. She has no rhonchi. She has no rales.  Abdominal: Soft. Normal appearance and bowel sounds are normal. There is no tenderness.  Neurological: She is alert.  Skin: Skin is warm, dry and intact. No rash noted.  Psychiatric: Her speech is normal and behavior is normal. Judgment and thought content normal. Her mood appears not anxious. Cognition and memory are normal. She does not exhibit a depressed mood.          Assessment & Plan:

## 2015-06-25 ENCOUNTER — Telehealth: Payer: Self-pay | Admitting: Family Medicine

## 2015-06-25 DIAGNOSIS — E1165 Type 2 diabetes mellitus with hyperglycemia: Principal | ICD-10-CM

## 2015-06-25 DIAGNOSIS — IMO0001 Reserved for inherently not codable concepts without codable children: Secondary | ICD-10-CM

## 2015-06-25 DIAGNOSIS — Z794 Long term (current) use of insulin: Principal | ICD-10-CM

## 2015-06-25 DIAGNOSIS — E785 Hyperlipidemia, unspecified: Secondary | ICD-10-CM

## 2015-06-25 NOTE — Telephone Encounter (Signed)
-----   Message from Alvina Chouerri J Walsh sent at 06/20/2015 10:39 AM EDT ----- Regarding: Lab orders for Friday, 4.28.17 Lab orders for a f/u appt

## 2015-06-28 ENCOUNTER — Other Ambulatory Visit (INDEPENDENT_AMBULATORY_CARE_PROVIDER_SITE_OTHER): Payer: BLUE CROSS/BLUE SHIELD

## 2015-06-28 DIAGNOSIS — E1165 Type 2 diabetes mellitus with hyperglycemia: Secondary | ICD-10-CM | POA: Diagnosis not present

## 2015-06-28 DIAGNOSIS — Z794 Long term (current) use of insulin: Secondary | ICD-10-CM

## 2015-06-28 DIAGNOSIS — IMO0001 Reserved for inherently not codable concepts without codable children: Secondary | ICD-10-CM

## 2015-06-28 DIAGNOSIS — E785 Hyperlipidemia, unspecified: Secondary | ICD-10-CM

## 2015-06-28 LAB — LIPID PANEL
CHOL/HDL RATIO: 3
CHOLESTEROL: 149 mg/dL (ref 0–200)
HDL: 51 mg/dL (ref >39.00)
LDL CALC: 67 mg/dL (ref 0–99)
NONHDL: 98.29
Triglycerides: 154 mg/dL — ABNORMAL HIGH (ref 0.0–149.0)
VLDL: 30.8 mg/dL (ref 0.0–40.0)

## 2015-06-28 LAB — HEMOGLOBIN A1C: HEMOGLOBIN A1C: 7 % — AB (ref 4.6–6.5)

## 2015-06-28 LAB — COMPREHENSIVE METABOLIC PANEL
ALBUMIN: 4 g/dL (ref 3.5–5.2)
ALT: 10 U/L (ref 0–35)
AST: 12 U/L (ref 0–37)
Alkaline Phosphatase: 51 U/L (ref 39–117)
BUN: 13 mg/dL (ref 6–23)
CHLORIDE: 105 meq/L (ref 96–112)
CO2: 27 mEq/L (ref 19–32)
CREATININE: 0.64 mg/dL (ref 0.40–1.20)
Calcium: 9.1 mg/dL (ref 8.4–10.5)
GFR: 107.86 mL/min (ref >60.00)
GLUCOSE: 175 mg/dL — AB (ref 70–99)
Potassium: 4.1 mEq/L (ref 3.5–5.1)
SODIUM: 137 meq/L (ref 135–145)
Total Bilirubin: 0.6 mg/dL (ref 0.2–1.2)
Total Protein: 6.9 g/dL (ref 6.0–8.3)

## 2015-07-01 ENCOUNTER — Encounter: Payer: Self-pay | Admitting: Family Medicine

## 2015-07-01 MED ORDER — LIRAGLUTIDE 18 MG/3ML ~~LOC~~ SOPN: PEN_INJECTOR | SUBCUTANEOUS | Status: DC

## 2015-07-05 ENCOUNTER — Ambulatory Visit (INDEPENDENT_AMBULATORY_CARE_PROVIDER_SITE_OTHER): Payer: BLUE CROSS/BLUE SHIELD | Admitting: Family Medicine

## 2015-07-05 ENCOUNTER — Encounter: Payer: Self-pay | Admitting: Family Medicine

## 2015-07-05 VITALS — BP 96/64 | HR 84 | Temp 98.2°F | Ht 65.5 in | Wt 163.0 lb

## 2015-07-05 DIAGNOSIS — E785 Hyperlipidemia, unspecified: Secondary | ICD-10-CM

## 2015-07-05 DIAGNOSIS — IMO0001 Reserved for inherently not codable concepts without codable children: Secondary | ICD-10-CM

## 2015-07-05 DIAGNOSIS — R5383 Other fatigue: Secondary | ICD-10-CM | POA: Diagnosis not present

## 2015-07-05 DIAGNOSIS — Z794 Long term (current) use of insulin: Secondary | ICD-10-CM

## 2015-07-05 DIAGNOSIS — E1165 Type 2 diabetes mellitus with hyperglycemia: Secondary | ICD-10-CM

## 2015-07-05 NOTE — Assessment & Plan Note (Signed)
Improved control on pravastatin as well as lowered with diet and weight loss compared to lastr year.

## 2015-07-05 NOTE — Patient Instructions (Addendum)
Increase victoza to  1.8 mg daily.  Try bedtime snack.  Continue  30 units daily of lantus.. Decrease further if CBGs < 60.  Keep working on healthy eating and regular exercise.

## 2015-07-05 NOTE — Progress Notes (Signed)
45 year old female presents for 2 month follow up DM  Improved control on victoza in addition to insulin 30 units daily.[name]. Has had to decrease insulin.  Now on victoza 1.2 mg daily She reports some initial nausea and upset stomache, decrease appetite. Improved some now. Lab Results  Component Value Date   HGBA1C 7.0* 06/28/2015   FBS improved 85-135 Bedtime sugar occ 65.[name]. Before decreasing to 30 units lantus.  post prandially: 89    Wt Readings from Last 3 Encounters:  07/05/15 163 lb (73.936 kg)  05/03/15 169 lb 8 oz (76.885 kg)  04/12/15 169 lb 8 oz (76.885 kg)   Elevated Cholesterol:  At goal LDL < 100 on pravastatin. Lab Results  Component Value Date   CHOL 149 06/28/2015   HDL 51.00 06/28/2015   LDLCALC 67 06/28/2015   LDLDIRECT 127.0 09/10/2011   TRIG 154.0* 06/28/2015   CHOLHDL 3 06/28/2015  Using medications without problems: None Muscle aches: None Diet compliance: healthy eating. Exercise: OCc Other complaints:  Review of Systems  Constitutional: Negative for fever and fatigue.  HENT: Negative for ear pain.  Eyes: Negative for pain.  Respiratory: Negative for chest tightness and shortness of breath.  Cardiovascular: Negative for chest pain, palpitations and leg swelling.  Gastrointestinal: Negative for abdominal pain.  Genitourinary: Negative for dysuria.       Objective:   Physical Exam  Constitutional: Vital signs are normal. She appears well-developed and well-nourished. She is cooperative. Non-toxic appearance. She does not appear ill. No distress.  HENT:  Head: Normocephalic.  Right Ear: Hearing, tympanic membrane, external ear and ear canal normal. Tympanic membrane is not erythematous, not retracted and not bulging.  Left Ear: Hearing, tympanic membrane, external ear and ear canal normal. Tympanic membrane is not erythematous, not retracted and not bulging.  Nose: No mucosal edema or rhinorrhea. Right sinus exhibits no maxillary sinus  tenderness and no frontal sinus tenderness. Left sinus exhibits no maxillary sinus tenderness and no frontal sinus tenderness.  Mouth/Throat: Uvula is midline, oropharynx is clear and moist and mucous membranes are normal.  Eyes: Conjunctivae, EOM and lids are normal. Pupils are equal, round, and reactive to light. Lids are everted and swept, no foreign bodies found.  Neck: Trachea normal and normal range of motion. Neck supple. Carotid bruit is not present. No thyroid mass and no thyromegaly present.  Cardiovascular: Normal rate, regular rhythm, S1 normal, S2 normal, normal heart sounds, intact distal pulses and normal pulses. Exam reveals no gallop and no friction rub.  No murmur heard. Pulmonary/Chest: Effort normal and breath sounds normal. No tachypnea. No respiratory distress. She has no decreased breath sounds. She has no wheezes. She has no rhonchi. She has no rales.  Abdominal: Soft. Normal appearance and bowel sounds are normal. There is no tenderness.  Neurological: She is alert.  Skin: Skin is warm, dry and intact. No rash noted.  Psychiatric: Her speech is normal and behavior is normal. Judgment and thought content normal. Her mood appears not anxious. Cognition and memory are normal. She does not exhibit a depressed mood.

## 2015-07-05 NOTE — Assessment & Plan Note (Signed)
IMproved control with addition of victoza. Increase to 1.8 mg. Encouraged exercise, weight loss, healthy eating habits. Continue weight loss. Lower lantus as needed, try bedtime snack to avoid lows.  follow up

## 2015-07-05 NOTE — Progress Notes (Signed)
Pre visit review using our clinic review tool, if applicable. No additional management support is needed unless otherwise documented below in the visit note. 

## 2015-08-19 ENCOUNTER — Encounter: Payer: Self-pay | Admitting: Family Medicine

## 2015-08-19 MED ORDER — LIRAGLUTIDE 18 MG/3ML ~~LOC~~ SOPN: PEN_INJECTOR | SUBCUTANEOUS | Status: DC

## 2015-10-07 ENCOUNTER — Other Ambulatory Visit: Payer: Self-pay | Admitting: Family Medicine

## 2015-10-07 NOTE — Telephone Encounter (Signed)
Received refill request electronically Last office visit 07/05/15 Request from pharmacy shows patient taking 36 units which does not match medication list showing 30 units. Please clarify

## 2015-10-29 ENCOUNTER — Other Ambulatory Visit: Payer: Self-pay | Admitting: Family Medicine

## 2015-11-01 ENCOUNTER — Telehealth: Payer: Self-pay | Admitting: Family Medicine

## 2015-11-01 ENCOUNTER — Other Ambulatory Visit (INDEPENDENT_AMBULATORY_CARE_PROVIDER_SITE_OTHER): Payer: BLUE CROSS/BLUE SHIELD

## 2015-11-01 DIAGNOSIS — Z794 Long term (current) use of insulin: Secondary | ICD-10-CM

## 2015-11-01 DIAGNOSIS — R5383 Other fatigue: Secondary | ICD-10-CM

## 2015-11-01 DIAGNOSIS — IMO0001 Reserved for inherently not codable concepts without codable children: Secondary | ICD-10-CM

## 2015-11-01 DIAGNOSIS — E1165 Type 2 diabetes mellitus with hyperglycemia: Secondary | ICD-10-CM | POA: Diagnosis not present

## 2015-11-01 DIAGNOSIS — E785 Hyperlipidemia, unspecified: Secondary | ICD-10-CM

## 2015-11-01 DIAGNOSIS — Z862 Personal history of diseases of the blood and blood-forming organs and certain disorders involving the immune mechanism: Secondary | ICD-10-CM

## 2015-11-01 LAB — COMPREHENSIVE METABOLIC PANEL
ALBUMIN: 4.1 g/dL (ref 3.5–5.2)
ALK PHOS: 49 U/L (ref 39–117)
ALT: 12 U/L (ref 0–35)
AST: 14 U/L (ref 0–37)
BUN: 10 mg/dL (ref 6–23)
CALCIUM: 8.9 mg/dL (ref 8.4–10.5)
CO2: 28 mEq/L (ref 19–32)
Chloride: 105 mEq/L (ref 96–112)
Creatinine, Ser: 0.59 mg/dL (ref 0.40–1.20)
GFR: 118.28 mL/min (ref >60.00)
GLUCOSE: 103 mg/dL — AB (ref 70–99)
POTASSIUM: 3.9 meq/L (ref 3.5–5.1)
Sodium: 139 mEq/L (ref 135–145)
TOTAL PROTEIN: 6.9 g/dL (ref 6.0–8.3)
Total Bilirubin: 0.5 mg/dL (ref 0.2–1.2)

## 2015-11-01 LAB — T4, FREE: Free T4: 0.86 ng/dL (ref 0.60–1.60)

## 2015-11-01 LAB — HEMOGLOBIN A1C: HEMOGLOBIN A1C: 6.7 % — AB (ref 4.6–6.5)

## 2015-11-01 LAB — LIPID PANEL
CHOLESTEROL: 165 mg/dL (ref 0–200)
HDL: 58.1 mg/dL (ref >39.00)
LDL Cholesterol: 91 mg/dL (ref 0–99)
NonHDL: 107.01
TRIGLYCERIDES: 82 mg/dL (ref 0.0–149.0)
Total CHOL/HDL Ratio: 3
VLDL: 16.4 mg/dL (ref 0.0–40.0)

## 2015-11-01 LAB — T3, FREE: T3 FREE: 3.3 pg/mL (ref 2.3–4.2)

## 2015-11-01 NOTE — Telephone Encounter (Signed)
-----   Message from Alvina Chouerri J Walsh sent at 10/25/2015 10:59 AM EDT ----- Regarding: Lab orders for Friday, 9.1.17 Patient is scheduled for CPX labs, please order future labs, Thanks , Camelia Engerri

## 2015-11-05 ENCOUNTER — Encounter: Payer: Self-pay | Admitting: Family Medicine

## 2015-11-05 ENCOUNTER — Ambulatory Visit (INDEPENDENT_AMBULATORY_CARE_PROVIDER_SITE_OTHER): Payer: BLUE CROSS/BLUE SHIELD | Admitting: Family Medicine

## 2015-11-05 VITALS — BP 115/89 | HR 98 | Temp 98.7°F | Ht 65.5 in | Wt 149.5 lb

## 2015-11-05 DIAGNOSIS — E785 Hyperlipidemia, unspecified: Secondary | ICD-10-CM | POA: Diagnosis not present

## 2015-11-05 DIAGNOSIS — Z23 Encounter for immunization: Secondary | ICD-10-CM

## 2015-11-05 DIAGNOSIS — E119 Type 2 diabetes mellitus without complications: Secondary | ICD-10-CM | POA: Diagnosis not present

## 2015-11-05 DIAGNOSIS — Z0000 Encounter for general adult medical examination without abnormal findings: Secondary | ICD-10-CM | POA: Diagnosis not present

## 2015-11-05 DIAGNOSIS — Z794 Long term (current) use of insulin: Secondary | ICD-10-CM

## 2015-11-05 LAB — HM DIABETES FOOT EXAM

## 2015-11-05 NOTE — Progress Notes (Signed)
The patient is here for annual wellness exam and preventative care.   Diabetes: Improved control on victoza, Lantus 30 Units daily and metformin  max.  Decreased as she has lost weight. Lab Results  Component Value Date   HGBA1C 6.7 (H) 11/01/2015  Using medications without difficulties:  Hypoglycemic episodes: 57-60 if taking PM metformin. Hyperglycemic episodes: none Feet problems: None Blood Sugars averaging: FBS  100-120, 2 hr postprandial 150-160 eye exam within last year: no  Microalbumin checked in 03/2105.  Elevated Cholesterol: Almost at goal <100 LDL on pravastatin 40 mg daily.  Lab Results  Component Value Date   CHOL 165 11/01/2015   HDL 58.10 11/01/2015   LDLCALC 91 11/01/2015   LDLDIRECT 127.0 09/10/2011   TRIG 82.0 11/01/2015   CHOLHDL 3 11/01/2015  Using medications without problems: None  Muscle aches: None  Diet compliance: Good  With victoza. Exercise:2-3 times a week walks , starting [employer]. Other complaints: BP at goal     She has lost 14 lbs since last OV.! Wt Readings from Last 3 Encounters:  11/05/15 149 lb 8 oz (67.8 kg)  07/05/15 163 lb (73.9 kg)  05/03/15 169 lb 8 oz (76.9 kg)  Body mass index is 24.5 kg/m.  Social History /Family History/Past Medical History reviewed and updated if needed. .  Review of Systems  Constitutional: Negative for fever, fatigue and unexpected weight change.  HENT: Negative for ear pain, congestion, sore throat, sneezing, trouble swallowing and sinus pressure.  Eyes: Negative for pain and itching.  Respiratory: Negative for cough, shortness of breath and wheezing.  Cardiovascular: Negative for chest pain, palpitations and leg swelling.  Gastrointestinal: Negative for nausea, abdominal pain, diarrhea, constipation and blood in stool.  Genitourinary: Negative for dysuria, hematuria, vaginal discharge, difficulty urinating and menstrual problem.  Skin: Negative for rash.  Neurological: Negative for  syncope, weakness, light-headedness, numbness and headaches.  Psychiatric/Behavioral: Negative for confusion and dysphoric mood. The patient is not nervous/anxious.  Objective:   Physical Exam  Constitutional: Vital signs are normal. She appears well-developed and well-nourished. She is cooperative. Non-toxic appearance. She does not appear ill. No distress.  HENT:  Head: Normocephalic.  Right Ear: Hearing, tympanic membrane, external ear and ear canal normal.  Left Ear: Hearing, tympanic membrane, external ear and ear canal normal.  Nose: Nose normal.  Eyes: Conjunctivae, EOM and lids are normal. Pupils are equal, round, and reactive to light. No foreign bodies found.  Neck: Trachea normal and normal range of motion. Neck supple. Carotid bruit is not present. No mass and no thyromegaly present.  Cardiovascular: Normal rate, regular rhythm, S1 normal, S2 normal, normal heart sounds and intact distal pulses. Exam reveals no gallop.  No murmur heard.  Pulmonary/Chest: Effort normal and breath sounds normal. No respiratory distress. She has no wheezes. She has no rhonchi. She has no rales.  Abdominal: Soft. Normal appearance and bowel sounds are normal. She exhibits no distension, no fluid wave, no abdominal bruit and no mass. There is no hepatosplenomegaly. There is no tenderness. There is no rebound, no guarding and no CVA tenderness. No hernia.  Genitourinary: Normal introitus for age, no external lesions, no vaginal discharge, mucosa pink and moist, no vaginal or cervical lesions, no vaginal atrophy, no friaility or hemorrhage, normal uterus size and position, no adnexal masses or tenderness.  Chaperoned exam.  PAP NOT  performed. Lymphadenopathy:  She has no cervical adenopathy.  She has no axillary adenopathy.  Neurological: She is alert. She has normal strength. No  cranial nerve deficit or sensory deficit.  Skin: Skin is warm, dry and intact. No rash noted.  Psychiatric: Her  speech is normal and behavior is normal. Judgment normal. Her mood appears not anxious. Cognition and memory are normal. She does not exhibit a depressed mood.   Diabetic foot exam:  Normal inspection  No skin breakdown  No calluses  Normal DP pulses  Normal sensation to light touch and monofilament  Nails normal  Assessment & Plan:   CPX: The patient's preventative maintenance and recommended screening tests for an annual wellness exam were reviewed in full today.  Brought up to date unless services declined.  Counselled on the importance of diet, exercise, and its role in overall health and mortality.  The patient's FH and SH was reviewed, including their home life, tobacco status, and drug and alcohol status.   DVE/PAP: nml pap 2016, neg HPV on q5 schedule Vaccines: Td uptodate, PNA vaccine uptodate. Refused prevnar. given flu Mammo: 05/30/2014, plan to schedule in 05/2015 Remote former smoker Refused STD screen.  No early colon cancer in family.

## 2015-11-05 NOTE — Progress Notes (Signed)
Pre visit review using our clinic review tool, if applicable. No additional management support is needed unless otherwise documented below in the visit note. 

## 2015-11-05 NOTE — Assessment & Plan Note (Signed)
Improved control with weight loss with victoza. Able to decrease metformin and lantus. Encouraged exercise, weight loss, healthy eating habits.

## 2015-11-05 NOTE — Addendum Note (Signed)
Addended by: Damita LackLORING, Kashari Chalmers S on: 11/05/2015 03:32 PM   Modules accepted: Orders

## 2015-11-05 NOTE — Patient Instructions (Addendum)
Plan schedule 04/2016 setting up with work.  Schedule eye exam when you are able.  Keep up great work on healthy eating and regular exercise.

## 2015-11-05 NOTE — Assessment & Plan Note (Signed)
Well controlled. Continue current medication.  

## 2015-11-14 ENCOUNTER — Other Ambulatory Visit: Payer: Self-pay | Admitting: Family Medicine

## 2015-11-22 ENCOUNTER — Other Ambulatory Visit: Payer: Self-pay | Admitting: Family Medicine

## 2015-12-10 LAB — HM DIABETES EYE EXAM

## 2015-12-27 ENCOUNTER — Encounter: Payer: Self-pay | Admitting: Family Medicine

## 2016-03-09 ENCOUNTER — Telehealth: Payer: Self-pay | Admitting: *Deleted

## 2016-03-09 NOTE — Telephone Encounter (Signed)
[facility] ([facility]  PA completed on CoverMyMeds and approved.  [facility] notified of approval via fax.

## 2016-04-23 ENCOUNTER — Telehealth: Payer: Self-pay | Admitting: Family Medicine

## 2016-04-23 DIAGNOSIS — E782 Mixed hyperlipidemia: Secondary | ICD-10-CM

## 2016-04-23 DIAGNOSIS — E119 Type 2 diabetes mellitus without complications: Secondary | ICD-10-CM

## 2016-04-23 DIAGNOSIS — Z794 Long term (current) use of insulin: Principal | ICD-10-CM

## 2016-04-23 DIAGNOSIS — Z862 Personal history of diseases of the blood and blood-forming organs and certain disorders involving the immune mechanism: Secondary | ICD-10-CM

## 2016-04-23 NOTE — Telephone Encounter (Signed)
-----   Message from Alvina Chouerri J Walsh sent at 04/21/2016 10:53 AM EST ----- Regarding: Lab orders for Friday, 2.27.18 Lab orders for a 6 month follow up appt

## 2016-04-28 ENCOUNTER — Other Ambulatory Visit (INDEPENDENT_AMBULATORY_CARE_PROVIDER_SITE_OTHER): Payer: BLUE CROSS/BLUE SHIELD

## 2016-04-28 DIAGNOSIS — Z862 Personal history of diseases of the blood and blood-forming organs and certain disorders involving the immune mechanism: Secondary | ICD-10-CM | POA: Diagnosis not present

## 2016-04-28 DIAGNOSIS — E782 Mixed hyperlipidemia: Secondary | ICD-10-CM

## 2016-04-28 DIAGNOSIS — Z794 Long term (current) use of insulin: Secondary | ICD-10-CM

## 2016-04-28 DIAGNOSIS — E119 Type 2 diabetes mellitus without complications: Secondary | ICD-10-CM

## 2016-04-28 LAB — CBC WITH DIFFERENTIAL/PLATELET
Basophils Absolute: 0 10*3/uL (ref 0.0–0.1)
Basophils Relative: 0.3 % (ref 0.0–3.0)
EOS ABS: 0.4 10*3/uL (ref 0.0–0.7)
Eosinophils Relative: 5.2 % — ABNORMAL HIGH (ref 0.0–5.0)
HEMATOCRIT: 39.7 % (ref 36.0–46.0)
Hemoglobin: 13.7 g/dL (ref 12.0–15.0)
LYMPHS PCT: 27.3 % (ref 12.0–46.0)
Lymphs Abs: 2.1 10*3/uL (ref 0.7–4.0)
MCHC: 34.4 g/dL (ref 30.0–36.0)
MCV: 92.7 fl (ref 78.0–100.0)
MONO ABS: 0.6 10*3/uL (ref 0.1–1.0)
Monocytes Relative: 7.7 % (ref 3.0–12.0)
NEUTROS ABS: 4.6 10*3/uL (ref 1.4–7.7)
Neutrophils Relative %: 59.5 % (ref 43.0–77.0)
PLATELETS: 278 10*3/uL (ref 150.0–400.0)
RBC: 4.29 Mil/uL (ref 3.87–5.11)
RDW: 12.9 % (ref 11.5–15.5)
WBC: 7.7 10*3/uL (ref 4.0–10.5)

## 2016-04-28 LAB — COMPREHENSIVE METABOLIC PANEL
ALT: 13 U/L (ref 0–35)
AST: 12 U/L (ref 0–37)
Albumin: 4.1 g/dL (ref 3.5–5.2)
Alkaline Phosphatase: 44 U/L (ref 39–117)
BILIRUBIN TOTAL: 0.5 mg/dL (ref 0.2–1.2)
BUN: 13 mg/dL (ref 6–23)
CALCIUM: 8.8 mg/dL (ref 8.4–10.5)
CO2: 27 meq/L (ref 19–32)
CREATININE: 0.62 mg/dL (ref 0.40–1.20)
Chloride: 105 mEq/L (ref 96–112)
GFR: 111.44 mL/min (ref >60.00)
Glucose, Bld: 172 mg/dL — ABNORMAL HIGH (ref 70–99)
Potassium: 4.1 mEq/L (ref 3.5–5.1)
Sodium: 138 mEq/L (ref 135–145)
Total Protein: 6.8 g/dL (ref 6.0–8.3)

## 2016-04-28 LAB — LIPID PANEL
CHOL/HDL RATIO: 2
Cholesterol: 169 mg/dL (ref 0–200)
HDL: 74.6 mg/dL (ref >39.00)
LDL Cholesterol: 79 mg/dL (ref 0–99)
NonHDL: 94.36
TRIGLYCERIDES: 76 mg/dL (ref 0.0–149.0)
VLDL: 15.2 mg/dL (ref 0.0–40.0)

## 2016-04-28 LAB — HEMOGLOBIN A1C: Hgb A1c MFr Bld: 7.3 % — ABNORMAL HIGH (ref 4.6–6.5)

## 2016-05-05 ENCOUNTER — Ambulatory Visit: Payer: BLUE CROSS/BLUE SHIELD | Admitting: Family Medicine

## 2016-05-07 ENCOUNTER — Encounter: Payer: Self-pay | Admitting: Family Medicine

## 2016-05-07 ENCOUNTER — Ambulatory Visit (INDEPENDENT_AMBULATORY_CARE_PROVIDER_SITE_OTHER): Payer: BLUE CROSS/BLUE SHIELD | Admitting: Family Medicine

## 2016-05-07 VITALS — BP 110/80 | HR 99 | Temp 98.2°F | Ht 65.5 in | Wt 150.8 lb

## 2016-05-07 DIAGNOSIS — Z794 Long term (current) use of insulin: Secondary | ICD-10-CM | POA: Diagnosis not present

## 2016-05-07 DIAGNOSIS — Z8249 Family history of ischemic heart disease and other diseases of the circulatory system: Secondary | ICD-10-CM | POA: Insufficient documentation

## 2016-05-07 DIAGNOSIS — E782 Mixed hyperlipidemia: Secondary | ICD-10-CM | POA: Diagnosis not present

## 2016-05-07 DIAGNOSIS — E119 Type 2 diabetes mellitus without complications: Secondary | ICD-10-CM

## 2016-05-07 LAB — HM DIABETES FOOT EXAM

## 2016-05-07 MED ORDER — TRAZODONE HCL 50 MG PO TABS: 25.0000 mg | ORAL_TABLET | Freq: Every evening | ORAL | 0 refills | Status: DC | PRN

## 2016-05-07 NOTE — Assessment & Plan Note (Signed)
Worsened control with loss of mother and GF.  Continue current regimen and follow up in 3 months Encouraged exercise, weight loss, healthy eating habits.

## 2016-05-07 NOTE — Patient Instructions (Addendum)
Trial of trazodone for sleep in next month.  Follow up if mood is not improving or getting worse in 1 month.  Makes sure taking medication regularly.  As feeling better.. Work on low carb diet, exercise.

## 2016-05-07 NOTE — Assessment & Plan Note (Addendum)
Eval with EKG: NSR, no QT changes.

## 2016-05-07 NOTE — Assessment & Plan Note (Signed)
Pt to consider referral to cardiology for risk assessment.  Start baby aspirin and consider higher dose statin at next follow up.

## 2016-05-07 NOTE — Progress Notes (Signed)
Subjective:    Patient ID: [name], female    DOB: [birth year], 46 y.o.   MRN: [id number]  HPI   46 year old female presents for 6 month DM follow up.  Diabetes:   Worsened control on unchanged regimen on victoza, metformin and lantus. She has forgotten meds, not been taking care of herself.   Her mother passed away in 03/19/16. MI age 60. Also had long QT syndrome. Father with MI age 63.She has good days and  Bad days. Some decrease in motivation. Feeling sad overall. Possible depression.  Poor sleep at night.  Waking up early in AMs Has been doing  Wt Readings from Last 3 Encounters:  05/07/16 150 lb 12 oz (68.4 kg)  11/05/15 149 lb 8 oz (67.8 kg)  07/05/15 163 lb (73.9 kg)  Body mass index is 24.7 kg/m.    Lab Results  Component Value Date   HGBA1C 7.3 (H) 04/28/2016  Using medications without difficulties: Hypoglycemic episodes: Hyperglycemic episodes: Feet problems: Blood Sugars averaging: FBS  eye exam within last year:  Elevated Cholesterol:  Great control of LDL on pravastatin 40 mg daily.   Lab Results  Component Value Date   CHOL 169 04/28/2016   HDL 74.60 04/28/2016   LDLCALC 79 04/28/2016   LDLDIRECT 127.0 09/10/2011   TRIG 76.0 04/28/2016   CHOLHDL 2 04/28/2016  Using medications without problems: none Muscle aches: none Diet compliance: poor Exercise: poor Other complaints:  BP Readings from Last 3 Encounters:  05/07/16 110/80  11/05/15 115/89  07/05/15 96/64     Review of Systems  Constitutional: Negative for fatigue and fever.  HENT: Negative for ear pain.   Eyes: Negative for pain.  Respiratory: Negative for chest tightness and shortness of breath.   Cardiovascular: Negative for chest pain, palpitations and leg swelling.  Gastrointestinal: Negative for abdominal pain.  Genitourinary: Negative for dysuria.       Objective:   Physical Exam  Constitutional: Vital signs are normal. She appears well-developed and  well-nourished. She is cooperative.  Non-toxic appearance. She does not appear ill. No distress.  HENT:  Head: Normocephalic.  Right Ear: Hearing, tympanic membrane, external ear and ear canal normal. Tympanic membrane is not erythematous, not retracted and not bulging.  Left Ear: Hearing, tympanic membrane, external ear and ear canal normal. Tympanic membrane is not erythematous, not retracted and not bulging.  Nose: No mucosal edema or rhinorrhea. Right sinus exhibits no maxillary sinus tenderness and no frontal sinus tenderness. Left sinus exhibits no maxillary sinus tenderness and no frontal sinus tenderness.  Mouth/Throat: Uvula is midline, oropharynx is clear and moist and mucous membranes are normal.  Eyes: Conjunctivae, EOM and lids are normal. Pupils are equal, round, and reactive to light. Lids are everted and swept, no foreign bodies found.  Neck: Trachea normal and normal range of motion. Neck supple. Carotid bruit is not present. No thyroid mass and no thyromegaly present.  Cardiovascular: Normal rate, regular rhythm, S1 normal, S2 normal, normal heart sounds, intact distal pulses and normal pulses.  Exam reveals no gallop and no friction rub.   No murmur heard. Pulmonary/Chest: Effort normal and breath sounds normal. No tachypnea. No respiratory distress. She has no decreased breath sounds. She has no wheezes. She has no rhonchi. She has no rales.  Abdominal: Soft. Normal appearance and bowel sounds are normal. There is no tenderness.  Neurological: She is alert.  Skin: Skin is warm, dry and intact. No rash noted.  Psychiatric: Her  speech is normal and behavior is normal. Judgment and thought content normal. Her mood appears not anxious. Cognition and memory are normal. She does not exhibit a depressed mood.    Diabetic foot exam: Normal inspection No skin breakdown No calluses  Normal DP pulses Normal sensation to light touch and monofilament Nails normal       Assessment  & Plan:

## 2016-05-07 NOTE — Assessment & Plan Note (Signed)
LDL well controlled on pravastatin 40 mg daily. Given family history of 2 parents with MI..  We may need to consider higher dose statin.

## 2016-05-07 NOTE — Progress Notes (Signed)
Pre visit review using our clinic review tool, if applicable. No additional management support is needed unless otherwise documented below in the visit note. 

## 2016-06-01 ENCOUNTER — Other Ambulatory Visit: Payer: Self-pay | Admitting: Family Medicine

## 2016-06-23 DIAGNOSIS — Z1231 Encounter for screening mammogram for malignant neoplasm of breast: Secondary | ICD-10-CM | POA: Diagnosis not present

## 2016-06-23 LAB — HM MAMMOGRAPHY

## 2016-06-26 ENCOUNTER — Encounter: Payer: Self-pay | Admitting: Family Medicine

## 2016-08-27 ENCOUNTER — Other Ambulatory Visit: Payer: Self-pay | Admitting: Family Medicine

## 2016-09-20 ENCOUNTER — Other Ambulatory Visit: Payer: Self-pay | Admitting: Family Medicine

## 2016-10-19 ENCOUNTER — Telehealth: Payer: Self-pay | Admitting: Family Medicine

## 2016-10-19 DIAGNOSIS — E782 Mixed hyperlipidemia: Secondary | ICD-10-CM

## 2016-10-19 DIAGNOSIS — E119 Type 2 diabetes mellitus without complications: Secondary | ICD-10-CM

## 2016-10-19 DIAGNOSIS — Z794 Long term (current) use of insulin: Principal | ICD-10-CM

## 2016-10-19 NOTE — Telephone Encounter (Signed)
-----   Message from Alvina Chou sent at 10/15/2016 10:12 AM EDT ----- Regarding: Lab orders for Monday, 8.20.18 Lab orders for a 5 month f/u

## 2016-10-22 ENCOUNTER — Other Ambulatory Visit (INDEPENDENT_AMBULATORY_CARE_PROVIDER_SITE_OTHER): Payer: BLUE CROSS/BLUE SHIELD

## 2016-10-22 DIAGNOSIS — Z794 Long term (current) use of insulin: Secondary | ICD-10-CM

## 2016-10-22 DIAGNOSIS — E119 Type 2 diabetes mellitus without complications: Secondary | ICD-10-CM | POA: Diagnosis not present

## 2016-10-22 LAB — COMPREHENSIVE METABOLIC PANEL
ALBUMIN: 4.2 g/dL (ref 3.5–5.2)
ALK PHOS: 47 U/L (ref 39–117)
ALT: 18 U/L (ref 0–35)
AST: 15 U/L (ref 0–37)
BUN: 17 mg/dL (ref 6–23)
CALCIUM: 9.1 mg/dL (ref 8.4–10.5)
CHLORIDE: 104 meq/L (ref 96–112)
CO2: 30 mEq/L (ref 19–32)
Creatinine, Ser: 0.62 mg/dL (ref 0.40–1.20)
GFR: 111.19 mL/min (ref >60.00)
Glucose, Bld: 166 mg/dL — ABNORMAL HIGH (ref 70–99)
POTASSIUM: 4 meq/L (ref 3.5–5.1)
SODIUM: 139 meq/L (ref 135–145)
TOTAL PROTEIN: 7.1 g/dL (ref 6.0–8.3)
Total Bilirubin: 0.5 mg/dL (ref 0.2–1.2)

## 2016-10-22 LAB — HEMOGLOBIN A1C: Hgb A1c MFr Bld: 7.3 % — ABNORMAL HIGH (ref 4.6–6.5)

## 2016-10-27 ENCOUNTER — Ambulatory Visit (INDEPENDENT_AMBULATORY_CARE_PROVIDER_SITE_OTHER): Payer: BLUE CROSS/BLUE SHIELD | Admitting: Family Medicine

## 2016-10-27 ENCOUNTER — Encounter: Payer: Self-pay | Admitting: Family Medicine

## 2016-10-27 VITALS — BP 100/72 | HR 84 | Temp 98.3°F | Ht 65.5 in | Wt 151.0 lb

## 2016-10-27 DIAGNOSIS — Z23 Encounter for immunization: Secondary | ICD-10-CM | POA: Diagnosis not present

## 2016-10-27 DIAGNOSIS — Z794 Long term (current) use of insulin: Secondary | ICD-10-CM | POA: Diagnosis not present

## 2016-10-27 DIAGNOSIS — E119 Type 2 diabetes mellitus without complications: Secondary | ICD-10-CM

## 2016-10-27 LAB — HM DIABETES FOOT EXAM

## 2016-10-27 LAB — MICROALBUMIN / CREATININE URINE RATIO
CREATININE, U: 456.6 mg/dL
Microalb Creat Ratio: 0.5 mg/g (ref 0.0–30.0)
Microalb, Ur: 2.1 mg/dL — ABNORMAL HIGH (ref 0.0–1.9)

## 2016-10-27 MED ORDER — LIRAGLUTIDE 18 MG/3ML ~~LOC~~ SOPN: PEN_INJECTOR | SUBCUTANEOUS | 1 refills | Status: DC

## 2016-10-27 MED ORDER — INSULIN GLARGINE 100 UNIT/ML SOLOSTAR PEN: 32.0000 [IU] | PEN_INJECTOR | Freq: Every day | SUBCUTANEOUS | 1 refills | Status: DC

## 2016-10-27 MED ORDER — METFORMIN HCL ER (OSM) 1000 MG PO TB24: ORAL_TABLET | ORAL | 3 refills | Status: DC

## 2016-10-27 MED ORDER — IBUPROFEN 200 MG PO TABS
400.0000 mg | ORAL_TABLET | Freq: Once | ORAL | Status: AC
  Administered 2016-10-27: 400 mg via ORAL

## 2016-10-27 MED ORDER — PRAVASTATIN SODIUM 40 MG PO TABS: ORAL_TABLET | ORAL | 3 refills | Status: DC

## 2016-10-27 NOTE — Progress Notes (Signed)
   Subjective:    Patient ID: [name], female    DOB: 12-15-1970, 46 y.o.   MRN: [id number]  HPI  46 year old female presents for 6 month follow up DM check  Diabetes:  Moderate control on lantus, metformin and victoza.  No abd pain. Lab Results  Component Value Date   HGBA1C 7.3 (H) 10/22/2016   1 week before menses .Marland Kitchen CBGs are high.. Then goes back to normal  She adjusts her Lantus dosing  Accordingly during theses times. Using medications without difficulties: Hypoglycemic episodes: none Hyperglycemic episodes: only prior to menses Feet problems: no ulcers Blood Sugars averaging: 130 eye exam within last year: yes  Social History /Family History/Past Medical History reviewed in detail and updated in EMR if needed.   Review of Systems  Constitutional: Negative for fatigue and fever.  HENT: Negative for congestion.   Eyes: Negative for pain.  Respiratory: Negative for cough and shortness of breath.   Cardiovascular: Negative for chest pain, palpitations and leg swelling.  Gastrointestinal: Negative for abdominal pain.  Genitourinary: Negative for dysuria and vaginal bleeding.  Musculoskeletal: Negative for back pain.  Neurological: Negative for syncope, light-headedness and headaches.  Psychiatric/Behavioral: Negative for dysphoric mood.       Objective:   Physical Exam  Constitutional: Vital signs are normal. She appears well-developed and well-nourished. She is cooperative.  Non-toxic appearance. She does not appear ill. No distress.  HENT:  Head: Normocephalic.  Right Ear: Hearing, tympanic membrane, external ear and ear canal normal. Tympanic membrane is not erythematous, not retracted and not bulging.  Left Ear: Hearing, tympanic membrane, external ear and ear canal normal. Tympanic membrane is not erythematous, not retracted and not bulging.  Nose: No mucosal edema or rhinorrhea. Right sinus exhibits no maxillary sinus tenderness and no frontal sinus  tenderness. Left sinus exhibits no maxillary sinus tenderness and no frontal sinus tenderness.  Mouth/Throat: Uvula is midline, oropharynx is clear and moist and mucous membranes are normal.  Eyes: Pupils are equal, round, and reactive to light. Conjunctivae, EOM and lids are normal. Lids are everted and swept, no foreign bodies found.  Neck: Trachea normal and normal range of motion. Neck supple. Carotid bruit is not present. No thyroid mass and no thyromegaly present.  Cardiovascular: Normal rate, regular rhythm, S1 normal, S2 normal, normal heart sounds, intact distal pulses and normal pulses.  Exam reveals no gallop and no friction rub.   No murmur heard. Pulmonary/Chest: Effort normal and breath sounds normal. No tachypnea. No respiratory distress. She has no decreased breath sounds. She has no wheezes. She has no rhonchi. She has no rales.  Abdominal: Soft. Normal appearance and bowel sounds are normal. There is no tenderness.  Neurological: She is alert.  Skin: Skin is warm, dry and intact. No rash noted.  Psychiatric: Her speech is normal and behavior is normal. Judgment and thought content normal. Her mood appears not anxious. Cognition and memory are normal. She does not exhibit a depressed mood.    Diabetic foot exam: Normal inspection No skin breakdown  central  Calluses, mild Normal DP pulses Normal sensation to light touch and monofilament Nails normal       Assessment & Plan:

## 2016-10-27 NOTE — Assessment & Plan Note (Signed)
Well controlled. Continue current medication. Due for microalbumin and PNA vaccine booster.

## 2016-10-27 NOTE — Addendum Note (Signed)
Addended by: Damita Lack on: 10/27/2016 10:41 AM   Modules accepted: Orders

## 2016-10-27 NOTE — Addendum Note (Signed)
Addended by: Damita Lack on: 10/27/2016 10:04 AM   Modules accepted: Orders

## 2016-10-27 NOTE — Patient Instructions (Signed)
Work on low [employer], continue regulalr exercise.

## 2016-12-09 LAB — HM DIABETES EYE EXAM

## 2016-12-23 DIAGNOSIS — Z23 Encounter for immunization: Secondary | ICD-10-CM | POA: Diagnosis not present

## 2017-01-09 ENCOUNTER — Other Ambulatory Visit: Payer: Self-pay | Admitting: Family Medicine

## 2017-02-01 ENCOUNTER — Other Ambulatory Visit: Payer: Self-pay | Admitting: Family Medicine

## 2017-02-11 ENCOUNTER — Telehealth: Payer: BLUE CROSS/BLUE SHIELD | Admitting: Physician Assistant

## 2017-02-11 ENCOUNTER — Encounter: Payer: Self-pay | Admitting: *Deleted

## 2017-02-11 ENCOUNTER — Ambulatory Visit (INDEPENDENT_AMBULATORY_CARE_PROVIDER_SITE_OTHER): Payer: BLUE CROSS/BLUE SHIELD

## 2017-02-11 ENCOUNTER — Ambulatory Visit
Admission: EM | Admit: 2017-02-11 | Discharge: 2017-02-11 | Disposition: A | Discharge status: Home / Self Care | Payer: BLUE CROSS/BLUE SHIELD | Attending: Family Medicine | Admitting: Family Medicine

## 2017-02-11 ENCOUNTER — Other Ambulatory Visit: Payer: Self-pay

## 2017-02-11 DIAGNOSIS — M542 Cervicalgia: Secondary | ICD-10-CM

## 2017-02-11 DIAGNOSIS — M25511 Pain in right shoulder: Secondary | ICD-10-CM

## 2017-02-11 DIAGNOSIS — M5412 Radiculopathy, cervical region: Secondary | ICD-10-CM | POA: Diagnosis not present

## 2017-02-11 MED ORDER — METAXALONE 800 MG PO TABS: 800.0000 mg | ORAL_TABLET | Freq: Three times a day (TID) | ORAL | 0 refills | Status: DC

## 2017-02-11 MED ORDER — NAPROXEN 500 MG PO TABS: 500.0000 mg | ORAL_TABLET | Freq: Two times a day (BID) | ORAL | 0 refills | Status: DC

## 2017-02-11 MED ORDER — DIAZEPAM 2 MG PO TABS: 2.0000 mg | ORAL_TABLET | Freq: Three times a day (TID) | ORAL | 0 refills | Status: DC

## 2017-02-11 MED ORDER — KETOROLAC TROMETHAMINE 60 MG/2ML IM SOLN
60.0000 mg | Freq: Once | INTRAMUSCULAR | Status: AC
  Administered 2017-02-11: 60 mg via INTRAMUSCULAR

## 2017-02-11 NOTE — Progress Notes (Signed)
Based on what you shared with me it looks like you have a condition that should be evaluated in a face to face office visit. It could be significant strain of your trapezius muscle with some spasm but there could also be an issue with the rotator cuff muscles or schoulder impingement. It is very hard to tell in this case without you having a physical exam. This would be the best way to narrow some things down and so you can get the most appropriate treatment, potentially even a steroid shot if needed to more quickly alleviate pain and inflammation.     NOTE: Even if you have entered your credit card information for this eVisit, you will not be charged.   If you are having a true medical emergency please call 911.  If you need an urgent face to face visit, [facility] has four urgent care centers for your convenience.  If you need care fast and have a high deductible or no insurance consider:   [email or web address]  [phone]  [address], [address] [place], [place] [address] 8 am to 8 pm Monday-Friday 10 am to 4 pm Saturday-Sunday   The following sites will take your  insurance:    . [facility]  [phone] Get Driving Directions Find a Provider at this Location  [address] [place], [place] [address] . 10 am to 8 pm Monday-Friday . 12 pm to 8 pm Saturday-Sunday   . [facility]  [phone] Get Driving Directions Find a Provider at this Location  [address], [address] [place], [place] [address] . 8 am to 8 pm Monday-Friday . 9 am to 6 pm Saturday . 11 am to 6 pm Sunday   . [facility] [place]  [phone] Get Driving Directions  [address] [place], [place] [address] . 8 am to 8 pm Monday-Friday . 8 am to 4 pm Saturday-Sunday   Your e-visit answers were reviewed by a board certified advanced clinical practitioner to complete your personal  care plan.  Thank you for using e-Visits.

## 2017-02-11 NOTE — ED Provider Notes (Signed)
[facility]    CSN: [id number] Arrival date & time: 02/11/17  1422     History   Chief Complaint Chief Complaint  Patient presents with  . Shoulder Pain    HPI [name] is a 46 y.o. female.   HPI  46 year old female who woke up this morning complaining of severe right neck, shoulder and posterior right upper arm pain.  She is right-hand dominant.  States that for the last 2 months she has had recurring episodes of the neck pain with radiation into her right upper extremity.  She states that she will occasionally have  numbness and tingling into her middle and ring fingers.  Denies Any specific prior injury to her neck eg. she denies any trips or any change in her sleep surfaces.  This episode was precipitated by shoveling snow.       Past Medical History:  Diagnosis Date  . Anemia, unspecified   . Other and unspecified hyperlipidemia   . Palpitations   . Type II or unspecified type diabetes mellitus without mention of complication, not stated as uncontrolled     Patient Active Problem List   Diagnosis Date Noted  . Family history of early CAD 05/07/2016  . Family history of long QT syndrome 05/07/2016  . POLYCYSTIC OVARIAN DISEASE 08/07/2009  . Diabetes mellitus type II, controlled, with no complications (HCC) 07/14/2007  . Hyperlipemia 07/14/2007  . History of iron deficiency anemia 07/14/2007    Past Surgical History:  Procedure Laterality Date  . CESAREAN SECTION    . ENDOMETRIAL BIOPSY      OB History    No data available       Home Medications    Prior to Admission medications   Medication Sig Start Date End Date Taking? Authorizing Provider  BD AUTOSHIELD DUO 30G X 5 MM MISC USE TO INJECT INSULIN TWICE DAILY 01/10/17  Yes Bedsole, Amy E, MD  Insulin Glargine (LANTUS SOLOSTAR) 100 UNIT/ML Solostar Pen Inject 32 Units into the skin daily. 10/27/16  Yes Bedsole, Amy E, MD  liraglutide (VICTOZA) 18 MG/3ML SOPN ADMINISTER 1.8 MG UNDER  THE SKIN DAILY 01/10/17  Yes Bedsole, Amy E, MD  metformin (FORTAMET) 1000 MG (OSM) 24 hr tablet TAKE 2 TABLETS(2000 MG) BY MOUTH DAILY WITH BREAKFAST 10/27/16  Yes Bedsole, Amy E, MD  pravastatin (PRAVACHOL) 40 MG tablet TAKE 1 TABLET(40 MG) BY MOUTH DAILY 10/27/16  Yes Bedsole, Amy E, MD  diazepam (VALIUM) 2 MG tablet Take 1 tablet (2 mg total) by mouth 3 (three) times daily. 02/11/17   Lutricia Feiloemer, Dorwin Fitzhenry P, PA-C  metaxalone (SKELAXIN) 800 MG tablet Take 1 tablet (800 mg total) by mouth 3 (three) times daily. Start taking after finishing Valium for muscle spasm 02/11/17   Lutricia Feiloemer, Jacquelene Kopecky P, PA-C  naproxen (NAPROSYN) 500 MG tablet Take 1 tablet (500 mg total) by mouth 2 (two) times daily with a meal. 02/11/17   Lutricia Feiloemer, Denney Shein P, PA-C    Family History Family History  Problem Relation Age of Onset  . Anxiety disorder Mother   . Arrhythmia Mother   . Heart attack Father   . Cancer Paternal Grandmother [id number]       breast  . Lymphoma Paternal Grandfather     Social History Social History   Tobacco Use  . Smoking status: Former Games developermoker  . Smokeless tobacco: Never Used  Substance Use Topics  . Alcohol use: Yes    Comment: Occasional  . Drug use: No  Allergies   Amoxicillin and Penicillins   Review of Systems Review of Systems  Constitutional: Positive for activity change. Negative for chills, fatigue and fever.  Musculoskeletal: Positive for neck pain and neck stiffness.  All other systems reviewed and are negative.    Physical Exam Triage Vital Signs ED Triage Vitals  Enc Vitals Group     BP 02/11/17 1435 131/88     Pulse Rate 02/11/17 1435 (!) 56     Resp 02/11/17 1435 16     Temp 02/11/17 1435 98.3 F (36.8 C)     Temp Source 02/11/17 1435 Oral     SpO2 02/11/17 1435 99 %     Weight 02/11/17 1437 150 lb (68 kg)     Height 02/11/17 1437 5\' 6"  (1.676 m)     Head Circumference --      Peak Flow --      Pain Score 02/11/17 1437 7     Pain Loc --      Pain Edu? --       Excl. in GC? --    No data found.  Updated Vital Signs BP 131/88 (BP Location: Right Arm)   Pulse (!) 56   Temp 98.3 F (36.8 C) (Oral)   Resp 16   Ht 5\' 6"  (1.676 m)   Wt 150 lb (68 kg)   LMP 02/09/2017 Comment: tubal  SpO2 99%   BMI 24.21 kg/m   Visual Acuity Right Eye Distance:   Left Eye Distance:   Bilateral Distance:    Right Eye Near:   Left Eye Near:    Bilateral Near:     Physical Exam  Constitutional: She is oriented to person, place, and time. She appears well-developed and well-nourished. No distress.  HENT:  Head: Normocephalic.  Eyes: EOM are normal. Pupils are equal, round, and reactive to light. Right eye exhibits no discharge. Left eye exhibits no discharge.  Neck:  Examination of the cervical spine shows the patient to have very decreased range of motion rightward and with extension.  Flexion is near normal.  He has pain lateral flexion and extension to the right.  Has a negative Spurling sign present.  Has a positive arm raise/shoulder abduction sign with  decreased pain on that side.  Sensation shows a hypnesthesia over the right forearm volarly as well as the index middle and ring fingers.  Strength that shows a half  loss of the triceps function.  However she has good wrist extension good elbow flexion finger extension and flexion.  There is no atrophy noted in the right upper extremity.  DTRs shows a 0 triceps reflex as compared to a 2+ on the left.  Remainder of  reflexes are normal.  Pulmonary/Chest: Effort normal and breath sounds normal.  Musculoskeletal:  Refer to neck physical exam  Neurological: She is alert and oriented to person, place, and time. She displays abnormal reflex. A sensory deficit is present. No cranial nerve deficit. She exhibits abnormal muscle tone. Coordination normal.  Skin: Skin is warm and dry. She is not diaphoretic.  Psychiatric: She has a normal mood and affect. Her behavior is normal. Judgment and thought content normal.    Nursing note and vitals reviewed.    UC Treatments / Results  Labs (all labs ordered are listed, but only abnormal results are displayed) Labs Reviewed - No data to display  EKG  EKG Interpretation None       Radiology Dg Cervical Spine Complete  Result Date: 02/11/2017  CLINICAL DATA:  46 year old who awakened this morning with severe right-sided neck pain, right shoulder and right arm pain. Patient states a two-month history of intermittent right neck pain and right shoulder pain. No known injuries. EXAM: CERVICAL SPINE - COMPLETE 4+ VIEW COMPARISON:  None. FINDINGS: Slight straightening of the usual cervical lordosis. Anatomic alignment. No visible fractures. Well-preserved disc spaces with minimal narrowing at C5-6. Normal prevertebral soft tissues. Mild right foraminal stenosis at C4-5 related to facet degenerative changes. Remaining neural foramina widely patent. No static evidence of instability. IMPRESSION: 1. Slight straightening of the usual lordosis which may be due to positioning or may indicate spasm. 2. Minimal disc space narrowing at C5-6 indicating minimal degenerative disc disease. 3. Mild right foraminal stenosis at C4-5 related to facet degenerative changes at this level. Electronically Signed   By: Hulan Saashomas  Lawrence M.D.   On: 02/11/2017 15:40    Procedures Procedures (including critical care time)  Medications Ordered in UC Medications  ketorolac (TORADOL) injection 60 mg (60 mg Intramuscular Given 02/11/17 1515)     Initial Impression / Assessment and Plan / UC Course  I have reviewed the triage vital signs and the nursing notes.  Pertinent labs & imaging results that were available during my care of the patient were reviewed by me and considered in my medical decision making (see chart for details).     Plan: 1. Test/x-ray results and diagnosis reviewed with patient 2. rx as per orders; risks, benefits, potential side effects reviewed with patient 3.  Recommend supportive treatment with rest and symptom avoidance.  Use ice 20 minutes out of every 2 hours 4-5 times daily for comfort.  After 48 hours may switch over to alternating heat and ice if found to be more effective.  Recommended using a collar at nighttime for sleep but not to wear it continuously.  Cautioned regarding the use of Valium with activities requiring concentration or judgment and not to drive while taking the medication.  This was also applied to the Skelaxin which she will switch over to after taking the Valium to completion.  Recommended she follow-up with her primary care physician particularly if she is not improving for further evaluation and possible MRI 4. F/u prn if symptoms worsen or don't improve   Final Clinical Impressions(s) / UC Diagnoses   Final diagnoses:  Cervical radiculopathy    ED Discharge Orders        Ordered    diazepam (VALIUM) 2 MG tablet  3 times daily     02/11/17 1611    metaxalone (SKELAXIN) 800 MG tablet  3 times daily     02/11/17 1611    naproxen (NAPROSYN) 500 MG tablet  2 times daily with meals     02/11/17 1611       Controlled Substance Prescriptions [place] Controlled Substance Registry consulted? Not Applicable   Lutricia FeilRoemer, Edith Groleau P, PA-C 02/11/17 1812

## 2017-02-11 NOTE — Discharge Instructions (Signed)
Use ice on your neck 20 minutes out of every 2 hours 4-5 times daily for comfort.  Consider using a cervical collar nighttime for sleep. Do Not use continuously

## 2017-02-11 NOTE — ED Triage Notes (Signed)
PAtient awoke this AM with severe right neck, shoulder, and arm pain. Patient reports having right shoulder and neck pain that comes and goes for the last 2 months.

## 2017-03-13 ENCOUNTER — Other Ambulatory Visit: Payer: Self-pay | Admitting: Family Medicine

## 2017-03-24 ENCOUNTER — Other Ambulatory Visit: Payer: Self-pay | Admitting: Family Medicine

## 2017-04-13 ENCOUNTER — Telehealth: Payer: Self-pay | Admitting: Family Medicine

## 2017-04-13 ENCOUNTER — Other Ambulatory Visit (INDEPENDENT_AMBULATORY_CARE_PROVIDER_SITE_OTHER): Payer: BLUE CROSS/BLUE SHIELD

## 2017-04-13 DIAGNOSIS — E119 Type 2 diabetes mellitus without complications: Secondary | ICD-10-CM | POA: Diagnosis not present

## 2017-04-13 DIAGNOSIS — Z794 Long term (current) use of insulin: Principal | ICD-10-CM

## 2017-04-13 LAB — COMPREHENSIVE METABOLIC PANEL
ALBUMIN: 4.1 g/dL (ref 3.5–5.2)
ALK PHOS: 44 U/L (ref 39–117)
ALT: 14 U/L (ref 0–35)
AST: 12 U/L (ref 0–37)
BUN: 19 mg/dL (ref 6–23)
CALCIUM: 8.9 mg/dL (ref 8.4–10.5)
CHLORIDE: 104 meq/L (ref 96–112)
CO2: 27 mEq/L (ref 19–32)
Creatinine, Ser: 0.65 mg/dL (ref 0.40–1.20)
GFR: 105.06 mL/min (ref >60.00)
Glucose, Bld: 174 mg/dL — ABNORMAL HIGH (ref 70–99)
POTASSIUM: 4 meq/L (ref 3.5–5.1)
Sodium: 138 mEq/L (ref 135–145)
TOTAL PROTEIN: 7 g/dL (ref 6.0–8.3)
Total Bilirubin: 0.6 mg/dL (ref 0.2–1.2)

## 2017-04-13 LAB — LIPID PANEL
Cholesterol: 190 mg/dL (ref 0–200)
HDL: 64 mg/dL (ref >39.00)
LDL Cholesterol: 114 mg/dL — ABNORMAL HIGH (ref 0–99)
NONHDL: 126.41
Total CHOL/HDL Ratio: 3
Triglycerides: 63 mg/dL (ref 0.0–149.0)
VLDL: 12.6 mg/dL (ref 0.0–40.0)

## 2017-04-13 LAB — HEMOGLOBIN A1C: HEMOGLOBIN A1C: 7.4 % — AB (ref 4.6–6.5)

## 2017-04-13 NOTE — Telephone Encounter (Signed)
-----   Message from Terri J Walsh sent at 04/08/2017  3:26 PM EST ----- Regarding: Lab orders for Tuesdaym 2.12.19 Patient is scheduled for CPX labs, please order future labs, Thanks , Terri  

## 2017-04-16 ENCOUNTER — Encounter: Payer: BLUE CROSS/BLUE SHIELD | Admitting: Family Medicine

## 2017-04-23 ENCOUNTER — Other Ambulatory Visit: Payer: Self-pay | Admitting: Family Medicine

## 2017-04-26 ENCOUNTER — Other Ambulatory Visit: Payer: Self-pay

## 2017-04-26 ENCOUNTER — Ambulatory Visit (INDEPENDENT_AMBULATORY_CARE_PROVIDER_SITE_OTHER): Payer: BLUE CROSS/BLUE SHIELD | Admitting: Family Medicine

## 2017-04-26 ENCOUNTER — Encounter: Payer: Self-pay | Admitting: Family Medicine

## 2017-04-26 VITALS — BP 120/80 | HR 71 | Temp 98.1°F | Ht 65.5 in | Wt 150.5 lb

## 2017-04-26 DIAGNOSIS — E119 Type 2 diabetes mellitus without complications: Secondary | ICD-10-CM | POA: Diagnosis not present

## 2017-04-26 DIAGNOSIS — E782 Mixed hyperlipidemia: Secondary | ICD-10-CM

## 2017-04-26 DIAGNOSIS — Z794 Long term (current) use of insulin: Secondary | ICD-10-CM

## 2017-04-26 DIAGNOSIS — Z0000 Encounter for general adult medical examination without abnormal findings: Secondary | ICD-10-CM | POA: Diagnosis not present

## 2017-04-26 MED ORDER — INSULIN GLARGINE 100 UNIT/ML SOLOSTAR PEN: PEN_INJECTOR | SUBCUTANEOUS | 3 refills | Status: DC

## 2017-04-26 MED ORDER — LIRAGLUTIDE 18 MG/3ML ~~LOC~~ SOPN: PEN_INJECTOR | SUBCUTANEOUS | 3 refills | Status: DC

## 2017-04-26 NOTE — Progress Notes (Signed)
Subjective:    Patient ID: [name], female    DOB: [birth year], 47 y.o.   MRN: [id number]  HPI The patient is here for annual wellness exam and preventative care.    Diabetes:  Inadequate control on victoza, lantus, metformin Lab Results  Component Value Date   HGBA1C 7.4 (H) 04/13/2017  Using medications without difficulties: Hypoglycemic episodes: none Hyperglycemic episodes: none Feet problems: Blood Sugars averaging: FBS 85-170.Marland Kitchen. Higher during menses eye exam within last year: yes Dr. Clydene PughWoodard, NOV 2018  Elevated Cholesterol:  Almost at goal < 100 on pravastatin. Lab Results  Component Value Date   CHOL 190 04/13/2017   HDL 64.00 04/13/2017   LDLCALC 114 (H) 04/13/2017   LDLDIRECT 127.0 09/10/2011   TRIG 63.0 04/13/2017   CHOLHDL 3 04/13/2017  Using medications without problems: Muscle aches:  Diet compliance: Good Exercise: off and on  Body mass index is 24.66 kg/m. Wt Readings from Last 3 Encounters:  04/26/17 150 lb 8 oz (68.3 kg)  02/11/17 150 lb (68 kg)  10/27/16 151 lb (68.5 kg)    Other complaints:  Social History /Family History/Past Medical History reviewed in detail and updated in EMR if needed. Blood pressure 120/80, pulse 71, temperature 98.1 F (36.7 C), temperature source Oral, height 5' 5.5" (1.664 m), weight 150 lb 8 oz (68.3 kg), last menstrual period 04/22/2017.  Review of Systems  Constitutional: Negative for fatigue and fever.  HENT: Negative for congestion.   Eyes: Negative for pain.  Respiratory: Negative for cough and shortness of breath.   Cardiovascular: Negative for chest pain, palpitations and leg swelling.  Gastrointestinal: Negative for abdominal pain.  Genitourinary: Negative for dysuria and vaginal bleeding.  Musculoskeletal: Negative for back pain.  Neurological: Negative for syncope, light-headedness and headaches.  Psychiatric/Behavioral: Negative for dysphoric mood.       Objective:   Physical Exam    Constitutional: Vital signs are normal. She appears well-developed and well-nourished. She is cooperative.  Non-toxic appearance. She does not appear ill. No distress.  HENT:  Head: Normocephalic.  Right Ear: Hearing, tympanic membrane, external ear and ear canal normal.  Left Ear: Hearing, tympanic membrane, external ear and ear canal normal.  Nose: Nose normal.  Eyes: Conjunctivae, EOM and lids are normal. Pupils are equal, round, and reactive to light. Lids are everted and swept, no foreign bodies found.  Neck: Trachea normal and normal range of motion. Neck supple. Carotid bruit is not present. No thyroid mass and no thyromegaly present.  Cardiovascular: Normal rate, regular rhythm, S1 normal, S2 normal, normal heart sounds and intact distal pulses. Exam reveals no gallop.  No murmur heard. Pulmonary/Chest: Effort normal and breath sounds normal. No respiratory distress. She has no wheezes. She has no rhonchi. She has no rales.  Abdominal: Soft. Normal appearance and bowel sounds are normal. She exhibits no distension, no fluid wave, no abdominal bruit and no mass. There is no hepatosplenomegaly. There is no tenderness. There is no rebound, no guarding and no CVA tenderness. No hernia.  Lymphadenopathy:    She has no cervical adenopathy.    She has no axillary adenopathy.  Neurological: She is alert. She has normal strength. No cranial nerve deficit or sensory deficit.  Skin: Skin is warm, dry and intact. No rash noted.  Psychiatric: Her speech is normal and behavior is normal. Judgment normal. Her mood appears not anxious. Cognition and memory are normal. She does not exhibit a depressed mood.  Assessment & Plan:  The patient's preventative maintenance and recommended screening tests for an annual wellness exam were reviewed in full today. Brought up to date unless services declined.  Counselled on the importance of diet, exercise, and its role in overall health and  mortality. The patient's FH and SH was reviewed, including their home life, tobacco status, and drug and alcohol status.    DVE/PAP: nml pap 2016, neg HPV on q5 schedule Vaccines: Td uptodate, PNA vaccine uptodate.  No family ovarian and uterine cancer. Mammo: 05/2016 Remote former smoker Refused STD screen.  No early colon cancer in family.

## 2017-04-26 NOTE — Assessment & Plan Note (Signed)
Discussed option of insulin pump, pt will consider. She will continue to work on glucose control but it is hard given fluctuating numbers with her menses. Encouraged exercise, weight loss, healthy eating habits.

## 2017-04-26 NOTE — Assessment & Plan Note (Signed)
Previously at goal on pravastatin.. Get back on track with exercise and low chol diet.

## 2017-04-26 NOTE — Patient Instructions (Addendum)
Work healthy eating, try to get back to regular exercise.   Call if interested in referral for insulin pump. Cal to schedule mammogram on your own.

## 2017-05-01 ENCOUNTER — Other Ambulatory Visit: Payer: Self-pay | Admitting: Family Medicine

## 2017-05-04 ENCOUNTER — Encounter: Payer: Self-pay | Admitting: Family Medicine

## 2017-05-04 MED ORDER — INSULIN PEN NEEDLE 32G X 4 MM MISC: 0 refills | Status: DC

## 2017-06-16 ENCOUNTER — Encounter: Payer: Self-pay | Admitting: Family Medicine

## 2017-06-16 DIAGNOSIS — Z1231 Encounter for screening mammogram for malignant neoplasm of breast: Secondary | ICD-10-CM | POA: Diagnosis not present

## 2017-07-15 ENCOUNTER — Telehealth: Payer: Self-pay | Admitting: Family Medicine

## 2017-07-15 DIAGNOSIS — E782 Mixed hyperlipidemia: Secondary | ICD-10-CM

## 2017-07-15 DIAGNOSIS — E119 Type 2 diabetes mellitus without complications: Secondary | ICD-10-CM

## 2017-07-15 DIAGNOSIS — Z794 Long term (current) use of insulin: Secondary | ICD-10-CM

## 2017-07-15 NOTE — Telephone Encounter (Signed)
-----   Message from Wendi Maya, RT sent at 07/07/2017  5:21 PM EDT ----- Regarding: Lab orders for 07/16/17 Please enter lab orders for Friday May 17th. Thanks-Lauren

## 2017-07-16 ENCOUNTER — Other Ambulatory Visit (INDEPENDENT_AMBULATORY_CARE_PROVIDER_SITE_OTHER): Payer: BLUE CROSS/BLUE SHIELD

## 2017-07-16 DIAGNOSIS — E119 Type 2 diabetes mellitus without complications: Secondary | ICD-10-CM | POA: Diagnosis not present

## 2017-07-16 DIAGNOSIS — Z794 Long term (current) use of insulin: Secondary | ICD-10-CM

## 2017-07-16 DIAGNOSIS — E782 Mixed hyperlipidemia: Secondary | ICD-10-CM | POA: Diagnosis not present

## 2017-07-16 LAB — LIPID PANEL
CHOLESTEROL: 195 mg/dL (ref 0–200)
HDL: 73.6 mg/dL (ref >39.00)
LDL Cholesterol: 111 mg/dL — ABNORMAL HIGH (ref 0–99)
NONHDL: 121.39
Total CHOL/HDL Ratio: 3
Triglycerides: 53 mg/dL (ref 0.0–149.0)
VLDL: 10.6 mg/dL (ref 0.0–40.0)

## 2017-07-16 LAB — COMPREHENSIVE METABOLIC PANEL
ALBUMIN: 3.9 g/dL (ref 3.5–5.2)
ALK PHOS: 41 U/L (ref 39–117)
ALT: 12 U/L (ref 0–35)
AST: 11 U/L (ref 0–37)
BILIRUBIN TOTAL: 0.5 mg/dL (ref 0.2–1.2)
BUN: 16 mg/dL (ref 6–23)
CALCIUM: 8.7 mg/dL (ref 8.4–10.5)
CO2: 28 mEq/L (ref 19–32)
CREATININE: 0.58 mg/dL (ref 0.40–1.20)
Chloride: 105 mEq/L (ref 96–112)
GFR: 119.68 mL/min (ref >60.00)
Glucose, Bld: 122 mg/dL — ABNORMAL HIGH (ref 70–99)
Potassium: 4 mEq/L (ref 3.5–5.1)
SODIUM: 139 meq/L (ref 135–145)
TOTAL PROTEIN: 6.9 g/dL (ref 6.0–8.3)

## 2017-07-16 LAB — HEMOGLOBIN A1C: Hgb A1c MFr Bld: 7 % — ABNORMAL HIGH (ref 4.6–6.5)

## 2017-07-23 ENCOUNTER — Ambulatory Visit: Payer: BLUE CROSS/BLUE SHIELD | Admitting: Family Medicine

## 2017-08-06 ENCOUNTER — Ambulatory Visit: Payer: BLUE CROSS/BLUE SHIELD | Admitting: Family Medicine

## 2017-08-06 ENCOUNTER — Encounter: Payer: Self-pay | Admitting: Family Medicine

## 2017-08-06 VITALS — BP 114/76 | HR 96 | Temp 98.9°F | Ht 65.5 in | Wt 155.0 lb

## 2017-08-06 DIAGNOSIS — E782 Mixed hyperlipidemia: Secondary | ICD-10-CM

## 2017-08-06 DIAGNOSIS — E119 Type 2 diabetes mellitus without complications: Secondary | ICD-10-CM | POA: Diagnosis not present

## 2017-08-06 DIAGNOSIS — Z794 Long term (current) use of insulin: Secondary | ICD-10-CM | POA: Diagnosis not present

## 2017-08-06 NOTE — Progress Notes (Signed)
Subjective:    Patient ID: [name], female    DOB: 1970/06/26, 47 y.o.   MRN: [id number]  HPI  47 year old female presents for follow up DM  Diabetes:   Improved control on current regimen. Lab Results  Component Value Date   HGBA1C 7.0 (H) 07/16/2017  Using medications without difficulties: Hypoglycemic episodes: 1-2,  58s Hyperglycemic episodes:none Feet problems: no ulcers Blood Sugars averaging: FBS 120s eye exam within last year: 11/2016  Elevated Cholesterol:  Almost at goal on pravastatin. Lab Results  Component Value Date   CHOL 195 07/16/2017   HDL 73.60 07/16/2017   LDLCALC 111 (H) 07/16/2017   LDLDIRECT 127.0 09/10/2011   TRIG 53.0 07/16/2017   CHOLHDL 3 07/16/2017  Using medications without problems: Muscle aches:  Diet compliance: low carb, low fat diet. Exercise: walking more Other complaints:  Blood pressure 114/76, pulse 96, temperature 98.9 F (37.2 C), temperature source Oral, height 5' 5.5" (1.664 m), weight 155 lb (70.3 kg), last menstrual period 07/31/2017, SpO2 98 %. Social History /Family History/Past Medical History reviewed in detail and updated in EMR if needed.   Review of Systems  Constitutional: Negative for fatigue and fever.  HENT: Negative for congestion.   Eyes: Negative for pain.  Respiratory: Negative for cough and shortness of breath.   Cardiovascular: Negative for chest pain, palpitations and leg swelling.  Gastrointestinal: Negative for abdominal pain.  Genitourinary: Negative for dysuria and vaginal bleeding.  Musculoskeletal: Negative for back pain.  Neurological: Negative for syncope, light-headedness and headaches.  Psychiatric/Behavioral: Negative for dysphoric mood.       Objective:   Physical Exam  Constitutional: Vital signs are normal. She appears well-developed and well-nourished. She is cooperative.  Non-toxic appearance. She does not appear ill. No distress.  HENT:  Head: Normocephalic.  Right Ear:  Hearing, tympanic membrane, external ear and ear canal normal. Tympanic membrane is not erythematous, not retracted and not bulging.  Left Ear: Hearing, tympanic membrane, external ear and ear canal normal. Tympanic membrane is not erythematous, not retracted and not bulging.  Nose: No mucosal edema or rhinorrhea. Right sinus exhibits no maxillary sinus tenderness and no frontal sinus tenderness. Left sinus exhibits no maxillary sinus tenderness and no frontal sinus tenderness.  Mouth/Throat: Uvula is midline, oropharynx is clear and moist and mucous membranes are normal.  Eyes: Pupils are equal, round, and reactive to light. Conjunctivae, EOM and lids are normal. Lids are everted and swept, no foreign bodies found.  Neck: Trachea normal and normal range of motion. Neck supple. Carotid bruit is not present. No thyroid mass and no thyromegaly present.  Cardiovascular: Normal rate, regular rhythm, S1 normal, S2 normal, normal heart sounds, intact distal pulses and normal pulses. Exam reveals no gallop and no friction rub.  No murmur heard. Pulmonary/Chest: Effort normal and breath sounds normal. No tachypnea. No respiratory distress. She has no decreased breath sounds. She has no wheezes. She has no rhonchi. She has no rales.  Abdominal: Soft. Normal appearance and bowel sounds are normal. There is no tenderness.  Neurological: She is alert.  Skin: Skin is warm, dry and intact. No rash noted.  Psychiatric: Her speech is normal and behavior is normal. Judgment and thought content normal. Her mood appears not anxious. Cognition and memory are normal. She does not exhibit a depressed mood.    Diabetic foot exam: Normal inspection No skin breakdown No calluses  Normal DP pulses Normal sensation to light touch and monofilament Nails normal  Assessment & Plan:

## 2017-08-06 NOTE — Patient Instructions (Signed)
Keep up the good work. Keep working on low cholesterol.

## 2017-08-06 NOTE — Assessment & Plan Note (Signed)
Improving on pravastatin.. Continue to work to lower.

## 2017-08-06 NOTE — Assessment & Plan Note (Signed)
Improving with lifestyle change. Continue current regimen.

## 2017-10-28 ENCOUNTER — Other Ambulatory Visit: Payer: Self-pay | Admitting: Family Medicine

## 2017-12-08 DIAGNOSIS — Z23 Encounter for immunization: Secondary | ICD-10-CM | POA: Diagnosis not present

## 2017-12-13 ENCOUNTER — Other Ambulatory Visit: Payer: Self-pay | Admitting: Family Medicine

## 2017-12-29 LAB — HM DIABETES EYE EXAM

## 2018-01-03 ENCOUNTER — Encounter: Payer: Self-pay | Admitting: Optometry

## 2018-02-17 ENCOUNTER — Encounter: Payer: Self-pay | Admitting: Family Medicine

## 2018-02-17 ENCOUNTER — Ambulatory Visit: Payer: BLUE CROSS/BLUE SHIELD | Admitting: Family Medicine

## 2018-02-17 VITALS — BP 108/68 | HR 116 | Temp 98.5°F | Resp 12 | Ht 65.5 in | Wt 153.8 lb

## 2018-02-17 DIAGNOSIS — R509 Fever, unspecified: Secondary | ICD-10-CM

## 2018-02-17 DIAGNOSIS — J069 Acute upper respiratory infection, unspecified: Secondary | ICD-10-CM | POA: Diagnosis not present

## 2018-02-17 DIAGNOSIS — B9789 Other viral agents as the cause of diseases classified elsewhere: Secondary | ICD-10-CM | POA: Diagnosis not present

## 2018-02-17 LAB — POCT INFLUENZA A/B
INFLUENZA B, POC: NEGATIVE
Influenza A, POC: NEGATIVE

## 2018-02-17 NOTE — Progress Notes (Signed)
Subjective:     [name] is a 47 y.o. female presenting for Cough (Symptoms started Monday 02/14/18 in the evening with a tickle in her throat and by the next day started to run a fever, cough-dry, body aches. Fever has been around 100 to 100.5. Feels like symptoms are in her chest now. Has taking NyQuil.)     Cough  This is a new problem. The current episode started in the past 7 days. The problem has been unchanged. The problem occurs every few minutes. The cough is non-productive. Associated symptoms include a fever, myalgias, a sore throat, shortness of breath and wheezing. Pertinent negatives include no ear congestion, ear pain, hemoptysis, nasal congestion or postnasal drip. The symptoms are aggravated by lying down. She has tried OTC cough suppressant for the symptoms. The treatment provided mild relief. There is no history of asthma or environmental allergies.     Review of Systems  Constitutional: Positive for fever.  HENT: Positive for sore throat. Negative for ear pain and postnasal drip.   Respiratory: Positive for cough, shortness of breath and wheezing. Negative for hemoptysis.   Musculoskeletal: Positive for myalgias.  Allergic/Immunologic: Negative for environmental allergies.     Social History   Tobacco Use  Smoking Status Former Smoker  Smokeless Tobacco Never Used        Objective:    BP Readings from Last 3 Encounters:  02/17/18 108/68  08/06/17 114/76  04/26/17 120/80   Wt Readings from Last 3 Encounters:  02/17/18 153 lb 12 oz (69.7 kg)  08/06/17 155 lb (70.3 kg)  04/26/17 150 lb 8 oz (68.3 kg)    BP 108/68   Pulse (!) 116   Temp 98.5 F (36.9 C)   Resp 12   Ht 5' 5.5" (1.664 m)   Wt 153 lb 12 oz (69.7 kg)   LMP 01/31/2018   SpO2 97%   BMI 25.20 kg/m    Physical Exam Constitutional:      General: She is not in acute distress.    Appearance: She is well-developed. She is not diaphoretic.  HENT:     Head: Normocephalic and  atraumatic.     Right Ear: Tympanic membrane and ear canal normal.     Left Ear: Tympanic membrane and ear canal normal.     Nose: Nose normal.     Right Sinus: No maxillary sinus tenderness or frontal sinus tenderness.     Left Sinus: No maxillary sinus tenderness or frontal sinus tenderness.     Mouth/Throat:     Pharynx: Uvula midline. No oropharyngeal exudate or posterior oropharyngeal erythema.     Tonsils: Swelling: 0 on the right. 0 on the left.  Eyes:     General: No scleral icterus.    Conjunctiva/sclera: Conjunctivae normal.  Neck:     Musculoskeletal: Neck supple.  Cardiovascular:     Rate and Rhythm: Normal rate and regular rhythm.     Heart sounds: Normal heart sounds. No murmur.  Pulmonary:     Effort: Pulmonary effort is normal. No respiratory distress.     Breath sounds: Normal breath sounds.  Lymphadenopathy:     Cervical: No cervical adenopathy.  Skin:    General: Skin is warm and dry.     Capillary Refill: Capillary refill takes less than 2 seconds.  Neurological:     Mental Status: She is alert.  Psychiatric:        Mood and Affect: Mood normal.  Behavior: Behavior normal.     Flu: negative      Assessment & Plan:   Problem List Items Addressed This Visit    None    Visit Diagnoses    Viral URI with cough    -  Primary   Fever, unspecified fever cause       Relevant Orders   POCT Influenza A/B (Completed)     Patient Instructions  Based on your symptoms, it looks like you have a virus.   Antibiotics are not need for a viral infection but the following will help:   1. Drink plenty of fluids 2. Get lots of rest  Sinus Congestion 1) Neti Pot (Saline rinse) -- 2 times day -- if tolerated 2) Flonase (Store Brand ok) - once daily 3) Over the counter congestion medications  Cough 1) Cough drops can be helpful 2) Nyquil (or nighttime cough medication) 3) Honey is proven to be one of the best cough medications    If you develop  fevers (Temperature >100.4), chills, worsening symptoms or symptoms lasting longer than 10 days return to clinic.       Return if symptoms worsen or fail to improve.  Lynnda ChildJessica R Cody, MD

## 2018-02-17 NOTE — Patient Instructions (Signed)
Based on your symptoms, it looks like you have a virus.   Antibiotics are not need for a viral infection but the following will help:   1. Drink plenty of fluids 2. Get lots of rest  Sinus Congestion 1) Neti Pot (Saline rinse) -- 2 times day -- if tolerated 2) Flonase (Store Brand ok) - once daily 3) Over the counter congestion medications  Cough 1) Cough drops can be helpful 2) Nyquil (or nighttime cough medication) 3) Honey is proven to be one of the best cough medications    If you develop fevers (Temperature >100.4), chills, worsening symptoms or symptoms lasting longer than 10 days return to clinic.    

## 2018-04-21 ENCOUNTER — Other Ambulatory Visit: Payer: BLUE CROSS/BLUE SHIELD

## 2018-04-21 ENCOUNTER — Telehealth: Payer: Self-pay | Admitting: Family Medicine

## 2018-04-21 DIAGNOSIS — Z862 Personal history of diseases of the blood and blood-forming organs and certain disorders involving the immune mechanism: Secondary | ICD-10-CM

## 2018-04-21 DIAGNOSIS — Z794 Long term (current) use of insulin: Principal | ICD-10-CM

## 2018-04-21 DIAGNOSIS — E782 Mixed hyperlipidemia: Secondary | ICD-10-CM

## 2018-04-21 DIAGNOSIS — E119 Type 2 diabetes mellitus without complications: Secondary | ICD-10-CM

## 2018-04-21 NOTE — Telephone Encounter (Signed)
-----   Message from Alvina Chou sent at 04/13/2018 12:39 PM EST ----- Regarding: Lab orders for Thursday, 2.20.20 Patient is scheduled for CPX labs, please order future labs, Thanks , Camelia Eng

## 2018-04-29 ENCOUNTER — Encounter: Payer: BLUE CROSS/BLUE SHIELD | Admitting: Family Medicine

## 2018-05-12 ENCOUNTER — Other Ambulatory Visit: Payer: BLUE CROSS/BLUE SHIELD

## 2018-05-13 ENCOUNTER — Other Ambulatory Visit: Payer: Self-pay | Admitting: Family Medicine

## 2018-05-19 ENCOUNTER — Encounter: Payer: BLUE CROSS/BLUE SHIELD | Admitting: Family Medicine

## 2018-06-14 ENCOUNTER — Other Ambulatory Visit: Payer: Self-pay | Admitting: Family Medicine

## 2018-06-15 ENCOUNTER — Telehealth: Payer: Self-pay | Admitting: Family Medicine

## 2018-06-15 NOTE — Telephone Encounter (Signed)
Lab4/23 Doxy me appointment 4/24

## 2018-06-15 NOTE — Telephone Encounter (Signed)
Left message asking pt to call office  Can we try schedule patient for drive up lab and doxy appointment with Dr. Ermalene Jordan to at least follow up on her Diabetes.

## 2018-06-21 ENCOUNTER — Telehealth: Payer: Self-pay

## 2018-06-21 NOTE — Telephone Encounter (Signed)
Left detailed VM w COVID screen and curbside info   

## 2018-06-23 ENCOUNTER — Other Ambulatory Visit: Payer: Self-pay

## 2018-06-23 ENCOUNTER — Other Ambulatory Visit (INDEPENDENT_AMBULATORY_CARE_PROVIDER_SITE_OTHER): Payer: BLUE CROSS/BLUE SHIELD

## 2018-06-23 DIAGNOSIS — Z794 Long term (current) use of insulin: Secondary | ICD-10-CM | POA: Diagnosis not present

## 2018-06-23 DIAGNOSIS — E782 Mixed hyperlipidemia: Secondary | ICD-10-CM

## 2018-06-23 DIAGNOSIS — Z862 Personal history of diseases of the blood and blood-forming organs and certain disorders involving the immune mechanism: Secondary | ICD-10-CM

## 2018-06-23 DIAGNOSIS — E119 Type 2 diabetes mellitus without complications: Secondary | ICD-10-CM | POA: Diagnosis not present

## 2018-06-23 LAB — CBC WITH DIFFERENTIAL/PLATELET
Basophils Absolute: 0 10*3/uL (ref 0.0–0.1)
Basophils Relative: 0.5 % (ref 0.0–3.0)
Eosinophils Absolute: 0.4 10*3/uL (ref 0.0–0.7)
Eosinophils Relative: 3.8 % (ref 0.0–5.0)
HCT: 38.9 % (ref 36.0–46.0)
Hemoglobin: 13.3 g/dL (ref 12.0–15.0)
Lymphocytes Relative: 26.8 % (ref 12.0–46.0)
Lymphs Abs: 2.5 10*3/uL (ref 0.7–4.0)
MCHC: 34.3 g/dL (ref 30.0–36.0)
MCV: 93.4 fl (ref 78.0–100.0)
Monocytes Absolute: 0.7 10*3/uL (ref 0.1–1.0)
Monocytes Relative: 7.2 % (ref 3.0–12.0)
Neutro Abs: 5.8 10*3/uL (ref 1.4–7.7)
Neutrophils Relative %: 61.7 % (ref 43.0–77.0)
Platelets: 288 10*3/uL (ref 150.0–400.0)
RBC: 4.16 Mil/uL (ref 3.87–5.11)
RDW: 13.1 % (ref 11.5–15.5)
WBC: 9.3 10*3/uL (ref 4.0–10.5)

## 2018-06-23 LAB — COMPREHENSIVE METABOLIC PANEL
ALT: 19 U/L (ref 0–35)
AST: 15 U/L (ref 0–37)
Albumin: 3.9 g/dL (ref 3.5–5.2)
Alkaline Phosphatase: 47 U/L (ref 39–117)
BUN: 17 mg/dL (ref 6–23)
CO2: 24 mEq/L (ref 19–32)
Calcium: 8.4 mg/dL (ref 8.4–10.5)
Chloride: 104 mEq/L (ref 96–112)
Creatinine, Ser: 0.61 mg/dL (ref 0.40–1.20)
GFR: 105.79 mL/min (ref >60.00)
Glucose, Bld: 161 mg/dL — ABNORMAL HIGH (ref 70–99)
Potassium: 4 mEq/L (ref 3.5–5.1)
Sodium: 135 mEq/L (ref 135–145)
Total Bilirubin: 0.6 mg/dL (ref 0.2–1.2)
Total Protein: 6.5 g/dL (ref 6.0–8.3)

## 2018-06-23 LAB — LIPID PANEL
Cholesterol: 194 mg/dL (ref 0–200)
HDL: 71.9 mg/dL
LDL Cholesterol: 105 mg/dL — ABNORMAL HIGH (ref 0–99)
NonHDL: 122.42
Total CHOL/HDL Ratio: 3
Triglycerides: 89 mg/dL (ref 0.0–149.0)
VLDL: 17.8 mg/dL (ref 0.0–40.0)

## 2018-06-23 LAB — HEMOGLOBIN A1C: Hgb A1c MFr Bld: 7.5 % — ABNORMAL HIGH (ref 4.6–6.5)

## 2018-06-24 ENCOUNTER — Telehealth: Payer: Self-pay | Admitting: *Deleted

## 2018-06-24 ENCOUNTER — Ambulatory Visit (INDEPENDENT_AMBULATORY_CARE_PROVIDER_SITE_OTHER): Payer: BLUE CROSS/BLUE SHIELD | Admitting: Family Medicine

## 2018-06-24 ENCOUNTER — Encounter: Payer: Self-pay | Admitting: Family Medicine

## 2018-06-24 VITALS — Ht 65.5 in

## 2018-06-24 DIAGNOSIS — Z862 Personal history of diseases of the blood and blood-forming organs and certain disorders involving the immune mechanism: Secondary | ICD-10-CM

## 2018-06-24 DIAGNOSIS — Z794 Long term (current) use of insulin: Secondary | ICD-10-CM | POA: Diagnosis not present

## 2018-06-24 DIAGNOSIS — E119 Type 2 diabetes mellitus without complications: Secondary | ICD-10-CM | POA: Diagnosis not present

## 2018-06-24 DIAGNOSIS — E782 Mixed hyperlipidemia: Secondary | ICD-10-CM

## 2018-06-24 MED ORDER — METFORMIN HCL ER (MOD) 1000 MG PO TB24: 1000.0000 mg | ORAL_TABLET | Freq: Every day | ORAL | 3 refills | Status: DC

## 2018-06-24 MED ORDER — LIRAGLUTIDE 18 MG/3ML ~~LOC~~ SOPN: PEN_INJECTOR | SUBCUTANEOUS | 3 refills | Status: DC

## 2018-06-24 MED ORDER — INSULIN GLARGINE 100 UNIT/ML SOLOSTAR PEN: PEN_INJECTOR | SUBCUTANEOUS | 3 refills | Status: DC

## 2018-06-24 MED ORDER — INSULIN PEN NEEDLE 32G X 4 MM MISC: 3 refills | Status: DC

## 2018-06-24 MED ORDER — PRAVASTATIN SODIUM 40 MG PO TABS: 40.0000 mg | ORAL_TABLET | Freq: Every day | ORAL | 3 refills | Status: DC

## 2018-06-24 MED ORDER — METFORMIN HCL ER 500 MG PO TB24: 1000.0000 mg | ORAL_TABLET | Freq: Every day | ORAL | 1 refills | Status: DC

## 2018-06-24 NOTE — Progress Notes (Signed)
Left message asking pt to call office  °

## 2018-06-24 NOTE — Telephone Encounter (Signed)
New prescription sent to [facility].  Left message for [name] with medicaiton change.

## 2018-06-24 NOTE — Assessment & Plan Note (Signed)
Almost at goal on pravastatin.. Encouraged exercise, weight loss, healthy eating habits.

## 2018-06-24 NOTE — Telephone Encounter (Signed)
Received fax from [facility]  Insurance will not cover Metformin ER 1000 mg.  They will cover Metformin XR 500 mg to take 2 tablets daily.  Ok to change prescription?

## 2018-06-24 NOTE — Assessment & Plan Note (Signed)
Slightly worsened control but not able to exercsie or eat a well as previously in pandemic.  Continue current meds. DM follow up  In 3 months

## 2018-06-24 NOTE — Assessment & Plan Note (Signed)
Nml CBC

## 2018-06-24 NOTE — Progress Notes (Signed)
VIRTUAL VISIT Due to national recommendations of social distancing due to COVID 19, a virtual visit is felt to be most appropriate for this patient at this time.   I connected with the patient on 06/24/18 at 10:00 AM EDT by virtual telehealth platform and verified that I am speaking with the correct person using two identifiers.   I discussed the limitations, risks, security and privacy concerns of performing an evaluation and management service by  virtual telehealth platform and the availability of in person appointments. I also discussed with the patient that there may be a patient responsible charge related to this service. The patient expressed understanding and agreed to proceed.  Patient location: Home Provider Location: [facility] Participants: Julie NoraAmy Syriah Delisi and Kathyrn SheriffJanet N Salva   Chief Complaint  Patient presents with  . Diabetes    History of Present Illness: 48 year old female presents for follow up DM and cholesterol  Diabetes:  Inadequate control.. slightly higher than last OV 11 months ago, A1C at 7.0 Lab Results  Component Value Date   HGBA1C 7.5 (H) 06/23/2018  Using medications without difficulties: Hypoglycemic episodes:none Hyperglycemic episodes:none Feet problems:no ulcers Blood Sugars averaging: fluctuates a lot, especially with menses,FBS 75-170s eye exam within last year: done Microalbuminuria at next OV.   Elevated Cholesterol: LDL almost at goal < 100 on pravastatin Lab Results  Component Value Date   CHOL 194 06/23/2018   HDL 71.90 06/23/2018   LDLCALC 105 (H) 06/23/2018   LDLDIRECT 127.0 09/10/2011   TRIG 89.0 06/23/2018   CHOLHDL 3 06/23/2018  Using medications without problems:none Muscle aches: none Diet compliance: Low carb, low chol diet Exercise:walking some Other complaints:   Hx of iron def anemai: Hg in nml range, no fatigue, no heavy blood loss  COVID 19 screen No recent travel or known exposure to COVID19 The patient  denies respiratory symptoms of COVID 19 at this time.  The importance of social distancing was discussed today.   Review of Systems  Constitutional: Negative for chills and fever.  HENT: Negative for congestion and ear pain.   Eyes: Negative for pain and redness.  Respiratory: Negative for cough and shortness of breath.   Cardiovascular: Negative for chest pain, palpitations and leg swelling.  Gastrointestinal: Negative for abdominal pain, blood in stool, constipation, diarrhea, nausea and vomiting.  Genitourinary: Negative for dysuria.  Musculoskeletal: Negative for falls and myalgias.  Skin: Negative for rash.  Neurological: Negative for dizziness.  Psychiatric/Behavioral: Negative for depression. The patient is not nervous/anxious.       Past Medical History:  Diagnosis Date  . Anemia, unspecified   . Other and unspecified hyperlipidemia   . Palpitations   . Type II or unspecified type diabetes mellitus without mention of complication, not stated as uncontrolled     reports that she has quit smoking. She has never used smokeless tobacco. She reports current alcohol use. She reports that she does not use drugs.   Current Outpatient Medications:  .  BD AUTOSHIELD DUO 30G X 5 MM MISC, USE TO INJECT INSULIN TWICE DAILY, Disp: 200 each, Rfl: 3 .  Insulin Glargine (LANTUS SOLOSTAR) 100 UNIT/ML Solostar Pen, ADMINISTER 32 UNITS UNDER THE SKIN DAILY, Disp: 15 mL, Rfl: 0 .  Insulin Pen Needle 32G X 4 MM MISC, Use to inject insulin twice daily.  Dx: E11.9, Disp: 90 each, Rfl: 0 .  liraglutide (VICTOZA) 18 MG/3ML SOPN, ADMINISTER 1.8 MG UNDER THE SKIN EVERY DAY, Disp: 27 mL, Rfl: 0 .  metformin (FORTAMET) 1000 MG (OSM) 24 hr tablet, TAKE 2 TABLETS(2000 MG) BY MOUTH DAILY WITH BREAKFAST, Disp: 180 tablet, Rfl: 0 .  pravastatin (PRAVACHOL) 40 MG tablet, TAKE 1 TABLET(40 MG) BY MOUTH DAILY, Disp: 90 tablet, Rfl: 2   Observations/Objective: Height 5' 5.5" (1.664 m), last menstrual period  05/30/2018.  Physical Exam  Physical Exam Constitutional:      General: She is not in acute distress. Pulmonary:     Effort: Pulmonary effort is normal. No respiratory distress.  Neurological:     Mental Status: She is alert and oriented to person, place, and time.  Psychiatric:        Mood and Affect: Mood normal.        Behavior: Behavior normal.   Assessment and Plan Diabetes mellitus type II, controlled, with no complications (HCC) Slightly worsened control but not able to exercsie or eat a well as previously in pandemic.  Continue current meds. DM follow up  In 3 months  Hyperlipemia Almost at goal on pravastatin.. Encouraged exercise, weight loss, healthy eating habits.   History of iron deficiency anemia Nml CBC     I discussed the assessment and treatment plan with the patient. The patient was provided an opportunity to ask questions and all were answered. The patient agreed with the plan and demonstrated an understanding of the instructions.   The patient was advised to call back or seek an in-person evaluation if the symptoms worsen or if the condition fails to improve as anticipated.     Julie Nora, MD

## 2018-06-24 NOTE — Telephone Encounter (Signed)
Okay to change prescription. 

## 2018-06-24 NOTE — Patient Instructions (Signed)
Continue working on exercise, weight loss, healthy eating habits.

## 2018-06-27 NOTE — Progress Notes (Signed)
7/23 labs  7/24 follow up

## 2018-09-22 ENCOUNTER — Telehealth: Payer: Self-pay | Admitting: Family Medicine

## 2018-09-22 ENCOUNTER — Other Ambulatory Visit: Payer: BLUE CROSS/BLUE SHIELD

## 2018-09-22 DIAGNOSIS — Z794 Long term (current) use of insulin: Secondary | ICD-10-CM

## 2018-09-22 DIAGNOSIS — E119 Type 2 diabetes mellitus without complications: Secondary | ICD-10-CM

## 2018-09-22 NOTE — Telephone Encounter (Signed)
-----   Message from Ellamae Sia sent at 09/14/2018  3:13 PM EDT ----- Regarding: Lab orders for Thursday, 7.23.20 Patient is scheduled for CPX labs, please order future labs, Thanks , Karna Christmas

## 2018-09-23 ENCOUNTER — Ambulatory Visit: Payer: BLUE CROSS/BLUE SHIELD | Admitting: Family Medicine

## 2018-10-06 ENCOUNTER — Ambulatory Visit: Payer: BLUE CROSS/BLUE SHIELD | Admitting: Family Medicine

## 2018-12-30 ENCOUNTER — Telehealth: Payer: Self-pay | Admitting: *Deleted

## 2018-12-30 LAB — HM DIABETES EYE EXAM

## 2018-12-30 MED ORDER — METFORMIN HCL ER 500 MG PO TB24: 1000.0000 mg | ORAL_TABLET | Freq: Every day | ORAL | 0 refills | Status: DC

## 2018-12-30 NOTE — Telephone Encounter (Signed)
Please schedule office visit to follow up on diabetes with fasting labs prior with Dr. Diona Browner.

## 2019-01-02 ENCOUNTER — Encounter: Payer: Self-pay | Admitting: Family Medicine

## 2019-01-04 NOTE — Telephone Encounter (Signed)
Left message asking pt to call office  °

## 2019-01-12 NOTE — Telephone Encounter (Signed)
Left message asking pt to call office  °

## 2019-01-13 NOTE — Telephone Encounter (Signed)
Labs 11/24  cpx 12/1

## 2019-01-23 ENCOUNTER — Telehealth: Payer: Self-pay | Admitting: Family Medicine

## 2019-01-23 DIAGNOSIS — E119 Type 2 diabetes mellitus without complications: Secondary | ICD-10-CM

## 2019-01-23 DIAGNOSIS — Z794 Long term (current) use of insulin: Secondary | ICD-10-CM

## 2019-01-23 NOTE — Telephone Encounter (Signed)
-----   Message from Ellamae Sia sent at 01/16/2019  2:21 PM EST ----- Regarding: Lab orders for Tuesday, 11.24.20 Lab orders for f/u appt

## 2019-01-24 ENCOUNTER — Other Ambulatory Visit (INDEPENDENT_AMBULATORY_CARE_PROVIDER_SITE_OTHER): Payer: BC Managed Care – PPO

## 2019-01-24 ENCOUNTER — Other Ambulatory Visit: Payer: Self-pay

## 2019-01-24 DIAGNOSIS — Z794 Long term (current) use of insulin: Secondary | ICD-10-CM | POA: Diagnosis not present

## 2019-01-24 DIAGNOSIS — E119 Type 2 diabetes mellitus without complications: Secondary | ICD-10-CM | POA: Diagnosis not present

## 2019-01-24 LAB — LIPID PANEL
Cholesterol: 199 mg/dL (ref 0–200)
HDL: 70.6 mg/dL (ref >39.00)
LDL Cholesterol: 113 mg/dL — ABNORMAL HIGH (ref 0–99)
NonHDL: 128.84
Total CHOL/HDL Ratio: 3
Triglycerides: 80 mg/dL (ref 0.0–149.0)
VLDL: 16 mg/dL (ref 0.0–40.0)

## 2019-01-24 LAB — HEMOGLOBIN A1C: Hgb A1c MFr Bld: 7.1 % — ABNORMAL HIGH (ref 4.6–6.5)

## 2019-01-24 LAB — COMPREHENSIVE METABOLIC PANEL
ALT: 14 U/L (ref 0–35)
AST: 13 U/L (ref 0–37)
Albumin: 3.8 g/dL (ref 3.5–5.2)
Alkaline Phosphatase: 46 U/L (ref 39–117)
BUN: 14 mg/dL (ref 6–23)
CO2: 28 mEq/L (ref 19–32)
Calcium: 8.7 mg/dL (ref 8.4–10.5)
Chloride: 103 mEq/L (ref 96–112)
Creatinine, Ser: 0.57 mg/dL (ref 0.40–1.20)
GFR: 114.11 mL/min (ref >60.00)
Glucose, Bld: 228 mg/dL — ABNORMAL HIGH (ref 70–99)
Potassium: 3.7 mEq/L (ref 3.5–5.1)
Sodium: 137 mEq/L (ref 135–145)
Total Bilirubin: 0.6 mg/dL (ref 0.2–1.2)
Total Protein: 6.9 g/dL (ref 6.0–8.3)

## 2019-01-24 LAB — MICROALBUMIN / CREATININE URINE RATIO
Creatinine,U: 178.3 mg/dL
Microalb Creat Ratio: 0.7 mg/g (ref 0.0–30.0)
Microalb, Ur: 1.2 mg/dL (ref 0.0–1.9)

## 2019-01-31 ENCOUNTER — Ambulatory Visit (INDEPENDENT_AMBULATORY_CARE_PROVIDER_SITE_OTHER): Payer: BC Managed Care – PPO | Admitting: Family Medicine

## 2019-01-31 ENCOUNTER — Encounter: Payer: Self-pay | Admitting: Family Medicine

## 2019-01-31 ENCOUNTER — Other Ambulatory Visit: Payer: Self-pay

## 2019-01-31 VITALS — BP 112/80 | HR 85 | Temp 98.0°F | Ht 65.5 in | Wt 154.5 lb

## 2019-01-31 DIAGNOSIS — E11319 Type 2 diabetes mellitus with unspecified diabetic retinopathy without macular edema: Secondary | ICD-10-CM

## 2019-01-31 DIAGNOSIS — Z23 Encounter for immunization: Secondary | ICD-10-CM

## 2019-01-31 DIAGNOSIS — E785 Hyperlipidemia, unspecified: Secondary | ICD-10-CM

## 2019-01-31 DIAGNOSIS — E1169 Type 2 diabetes mellitus with other specified complication: Secondary | ICD-10-CM | POA: Diagnosis not present

## 2019-01-31 DIAGNOSIS — Z794 Long term (current) use of insulin: Secondary | ICD-10-CM

## 2019-01-31 LAB — HM DIABETES FOOT EXAM

## 2019-01-31 MED ORDER — VICTOZA 18 MG/3ML ~~LOC~~ SOPN: PEN_INJECTOR | SUBCUTANEOUS | 3 refills | Status: DC

## 2019-01-31 MED ORDER — ATORVASTATIN CALCIUM 20 MG PO TABS: 20.0000 mg | ORAL_TABLET | Freq: Every day | ORAL | 11 refills | Status: DC

## 2019-01-31 MED ORDER — LANTUS SOLOSTAR 100 UNIT/ML ~~LOC~~ SOPN: PEN_INJECTOR | SUBCUTANEOUS | 3 refills | Status: DC

## 2019-01-31 NOTE — Patient Instructions (Signed)
Work on low carb low chol diet, increases exercise.  Stop pravastatin and change to atorvastatin daily.  Can return in 3 months for chol check if you would like.

## 2019-01-31 NOTE — Assessment & Plan Note (Signed)
IMproving control with lifestyle changes on current regimen.

## 2019-01-31 NOTE — Progress Notes (Signed)
No critical labs need to be addressed urgently. We will discuss labs in detail at upcoming office visit.   

## 2019-01-31 NOTE — Assessment & Plan Note (Addendum)
ON statin but LDL > goal 100. Stop pravastatin and change to atorvastatin 20 mg daily.

## 2019-01-31 NOTE — Assessment & Plan Note (Signed)
Followed by opthamology. 

## 2019-01-31 NOTE — Progress Notes (Signed)
Chief Complaint  Patient presents with  . Diabetes    History of Present Illness: HPI  48 year old female presents for DM follow up.  Diabetes:  Improved A1C Almost at goal on insulin, victoza, metfromin Lab Results  Component Value Date   HGBA1C 7.1 (H) 01/24/2019  Using medications without difficulties: Hypoglycemic episodes: Hyperglycemic episodes: Feet problems: no ulcer Blood Sugars averaging: FBS up and down.. in relation to menses. eye exam within last year:10./20 retinopathy   Elevated Cholesterol: LDL not at goal on pravastatin 40 mg daily. Lab Results  Component Value Date   CHOL 199 01/24/2019   HDL 70.60 01/24/2019   LDLCALC 113 (H) 01/24/2019   LDLDIRECT 127.0 09/10/2011   TRIG 80.0 01/24/2019   CHOLHDL 3 01/24/2019  Using medications without problems:none Muscle aches: none Diet compliance: good  Exercise: Walking Other complaints:     This visit occurred during the SARS-CoV-2 public health emergency.  Safety protocols were in place, including screening questions prior to the visit, additional usage of staff PPE, and extensive cleaning of exam room while observing appropriate contact time as indicated for disinfecting solutions.   COVID 19 screen:  No recent travel or known exposure to COVID19 The patient denies respiratory symptoms of COVID 19 at this time. The importance of social distancing was discussed today.     ROS    Past Medical History:  Diagnosis Date  . Anemia, unspecified   . Other and unspecified hyperlipidemia   . Palpitations   . Type II or unspecified type diabetes mellitus without mention of complication, not stated as uncontrolled     reports that she has quit smoking. She has never used smokeless tobacco. She reports current alcohol use. She reports that she does not use drugs.   Current Outpatient Medications:  .  BD AUTOSHIELD DUO 30G X 5 MM MISC, USE TO INJECT INSULIN TWICE DAILY, Disp: 200 each, Rfl: 3 .  Insulin  Glargine (LANTUS SOLOSTAR) 100 UNIT/ML Solostar Pen, ADMINISTER 32 UNITS UNDER THE SKIN DAILY (Patient taking differently: ADMINISTER 36 UNITS UNDER THE SKIN DAILY), Disp: 15 mL, Rfl: 3 .  Insulin Pen Needle 32G X 4 MM MISC, Use to inject insulin twice daily.  Dx: E11.9, Disp: 90 each, Rfl: 3 .  liraglutide (VICTOZA) 18 MG/3ML SOPN, ADMINISTER 1.8 MG UNDER THE SKIN EVERY DAY, Disp: 27 mL, Rfl: 3 .  metFORMIN (GLUCOPHAGE-XR) 500 MG 24 hr tablet, Take 2 tablets (1,000 mg total) by mouth daily with breakfast., Disp: 180 tablet, Rfl: 0 .  pravastatin (PRAVACHOL) 40 MG tablet, Take 1 tablet (40 mg total) by mouth daily., Disp: 90 tablet, Rfl: 3   Observations/Objective: Blood pressure 112/80, pulse 85, temperature 98 F (36.7 C), temperature source Temporal, height 5' 5.5" (1.664 m), weight 154 lb 8 oz (70.1 kg), last menstrual period 01/22/2019, SpO2 99 %.  Physical Exam Constitutional:      General: She is not in acute distress.    Appearance: Normal appearance. She is well-developed. She is not ill-appearing or toxic-appearing.  HENT:     Head: Normocephalic.     Right Ear: Hearing, tympanic membrane, ear canal and external ear normal. Tympanic membrane is not erythematous, retracted or bulging.     Left Ear: Hearing, tympanic membrane, ear canal and external ear normal. Tympanic membrane is not erythematous, retracted or bulging.     Nose: No mucosal edema or rhinorrhea.     Right Sinus: No maxillary sinus tenderness or frontal sinus tenderness.  Left Sinus: No maxillary sinus tenderness or frontal sinus tenderness.     Mouth/Throat:     Pharynx: Uvula midline.  Eyes:     General: Lids are normal. Lids are everted, no foreign bodies appreciated.     Conjunctiva/sclera: Conjunctivae normal.     Pupils: Pupils are equal, round, and reactive to light.  Neck:     Musculoskeletal: Normal range of motion and neck supple.     Thyroid: No thyroid mass or thyromegaly.     Vascular: No carotid  bruit.     Trachea: Trachea normal.  Cardiovascular:     Rate and Rhythm: Normal rate and regular rhythm.     Pulses: Normal pulses.     Heart sounds: Normal heart sounds, S1 normal and S2 normal. No murmur. No friction rub. No gallop.   Pulmonary:     Effort: Pulmonary effort is normal. No tachypnea or respiratory distress.     Breath sounds: Normal breath sounds. No decreased breath sounds, wheezing, rhonchi or rales.  Abdominal:     General: Bowel sounds are normal.     Palpations: Abdomen is soft.     Tenderness: There is no abdominal tenderness.  Skin:    General: Skin is warm and dry.     Findings: No rash.  Neurological:     Mental Status: She is alert.  Psychiatric:        Mood and Affect: Mood is not anxious or depressed.        Speech: Speech normal.        Behavior: Behavior normal. Behavior is cooperative.        Thought Content: Thought content normal.        Judgment: Judgment normal.      Diabetic foot exam: Normal inspection No skin breakdown No calluses  Normal DP pulses Normal sensation to light touch and monofilament Nails normal  Assessment and Plan   Diabetes mellitus type II, controlled, with no complications ([place]) IMproving control with lifestyle changes on current regimen.  Diabetic retinopathy associated with controlled type 2 diabetes mellitus ([place]) Followed by opthamology.  Hyperlipidemia associated with type 2 diabetes mellitus ([place]) ON statin but LDL > goal 100. Stop pravastatin and change to atorvastatin 20 mg daily.     Eliezer Lofts, MD

## 2019-05-28 ENCOUNTER — Ambulatory Visit: Payer: Self-pay | Attending: Internal Medicine

## 2019-05-28 DIAGNOSIS — Z23 Encounter for immunization: Secondary | ICD-10-CM

## 2019-05-28 NOTE — Progress Notes (Signed)
   Covid-19 Vaccination Clinic  Name:  [name]    MRN: [id number] DOB: 12/20/1970  05/28/2019  [name] was observed post Covid-19 immunization for 15 minutes without incident. She was provided with Vaccine Information Sheet and instruction to access the V-Safe system.   [name] was instructed to call 911 with any severe reactions post vaccine: Marland Kitchen Difficulty breathing  . Swelling of face and throat  . A fast heartbeat  . A bad rash all over body  . Dizziness and weakness   Immunizations Administered    Name Date Dose VIS Date Route   Pfizer COVID-19 Vaccine 05/28/2019 12:32 PM 0.3 mL 02/10/2019 Intramuscular   Manufacturer: [employer], [employer]   Lot: [id number]   NDC: [address]-7

## 2019-06-05 ENCOUNTER — Other Ambulatory Visit: Payer: Self-pay | Admitting: *Deleted

## 2019-06-05 MED ORDER — METFORMIN HCL ER 500 MG PO TB24: 1000.0000 mg | ORAL_TABLET | Freq: Every day | ORAL | 0 refills | Status: DC

## 2019-06-21 ENCOUNTER — Ambulatory Visit: Payer: Self-pay | Attending: Internal Medicine

## 2019-06-21 DIAGNOSIS — Z23 Encounter for immunization: Secondary | ICD-10-CM

## 2019-06-21 NOTE — Progress Notes (Signed)
   Covid-19 Vaccination Clinic  Name:  [name]    MRN: [id number] DOB: 01-13-1971  06/21/2019  [name] was observed post Covid-19 immunization for 15 minutes without incident. She was provided with Vaccine Information Sheet and instruction to access the V-Safe system.   [name] was instructed to call 911 with any severe reactions post vaccine: Marland Kitchen Difficulty breathing  . Swelling of face and throat  . A fast heartbeat  . A bad rash all over body  . Dizziness and weakness   Immunizations Administered    Name Date Dose VIS Date Route   Pfizer COVID-19 Vaccine 06/21/2019  3:52 PM 0.3 mL 04/26/2018 Intramuscular   Manufacturer: [employer], [employer]   Lot: [id number]   NDC: [address]-9

## 2019-07-26 ENCOUNTER — Telehealth: Payer: Self-pay | Admitting: Family Medicine

## 2019-07-26 DIAGNOSIS — E11319 Type 2 diabetes mellitus with unspecified diabetic retinopathy without macular edema: Secondary | ICD-10-CM

## 2019-07-26 DIAGNOSIS — E782 Mixed hyperlipidemia: Secondary | ICD-10-CM

## 2019-07-26 DIAGNOSIS — Z862 Personal history of diseases of the blood and blood-forming organs and certain disorders involving the immune mechanism: Secondary | ICD-10-CM

## 2019-07-26 NOTE — Telephone Encounter (Signed)
-----   Message from Alvina Chou sent at 07/12/2019 10:43 AM EDT ----- Regarding: lab orders for Friday, 5.28.21 Patient is scheduled for CPX labs, please order future labs, Thanks , Camelia Eng

## 2019-07-28 ENCOUNTER — Other Ambulatory Visit (INDEPENDENT_AMBULATORY_CARE_PROVIDER_SITE_OTHER): Payer: BC Managed Care – PPO

## 2019-07-28 DIAGNOSIS — Z794 Long term (current) use of insulin: Secondary | ICD-10-CM

## 2019-07-28 DIAGNOSIS — E11319 Type 2 diabetes mellitus with unspecified diabetic retinopathy without macular edema: Secondary | ICD-10-CM

## 2019-07-28 LAB — COMPREHENSIVE METABOLIC PANEL
ALT: 20 U/L (ref 0–35)
AST: 17 U/L (ref 0–37)
Albumin: 4.1 g/dL (ref 3.5–5.2)
Alkaline Phosphatase: 44 U/L (ref 39–117)
BUN: 10 mg/dL (ref 6–23)
CO2: 27 mEq/L (ref 19–32)
Calcium: 8.8 mg/dL (ref 8.4–10.5)
Chloride: 102 mEq/L (ref 96–112)
Creatinine, Ser: 0.61 mg/dL (ref 0.40–1.20)
GFR: 105.28 mL/min (ref >60.00)
Glucose, Bld: 234 mg/dL — ABNORMAL HIGH (ref 70–99)
Potassium: 3.8 mEq/L (ref 3.5–5.1)
Sodium: 136 mEq/L (ref 135–145)
Total Bilirubin: 0.5 mg/dL (ref 0.2–1.2)
Total Protein: 6.6 g/dL (ref 6.0–8.3)

## 2019-07-28 LAB — LIPID PANEL
Cholesterol: 154 mg/dL (ref 0–200)
HDL: 71.1 mg/dL (ref >39.00)
LDL Cholesterol: 70 mg/dL (ref 0–99)
NonHDL: 82.92
Total CHOL/HDL Ratio: 2
Triglycerides: 64 mg/dL (ref 0.0–149.0)
VLDL: 12.8 mg/dL (ref 0.0–40.0)

## 2019-07-28 LAB — HEMOGLOBIN A1C: Hgb A1c MFr Bld: 8 % — ABNORMAL HIGH (ref 4.6–6.5)

## 2019-07-28 NOTE — Progress Notes (Signed)
No critical labs need to be addressed urgently. We will discuss labs in detail at upcoming office visit.   

## 2019-08-04 ENCOUNTER — Encounter: Payer: Self-pay | Admitting: Family Medicine

## 2019-08-04 ENCOUNTER — Ambulatory Visit (INDEPENDENT_AMBULATORY_CARE_PROVIDER_SITE_OTHER): Payer: BC Managed Care – PPO | Admitting: Family Medicine

## 2019-08-04 ENCOUNTER — Other Ambulatory Visit: Payer: Self-pay

## 2019-08-04 ENCOUNTER — Other Ambulatory Visit (HOSPITAL_COMMUNITY)
Admission: RE | Admit: 2019-08-04 | Discharge: 2019-08-04 | Disposition: A | Discharge status: Home / Self Care | Payer: BC Managed Care – PPO | Source: Ambulatory Visit | Attending: Family Medicine | Admitting: Family Medicine

## 2019-08-04 VITALS — BP 110/60 | HR 96 | Temp 98.9°F | Ht 65.25 in | Wt 151.5 lb

## 2019-08-04 DIAGNOSIS — Z0000 Encounter for general adult medical examination without abnormal findings: Secondary | ICD-10-CM | POA: Diagnosis not present

## 2019-08-04 DIAGNOSIS — E119 Type 2 diabetes mellitus without complications: Secondary | ICD-10-CM

## 2019-08-04 DIAGNOSIS — Z124 Encounter for screening for malignant neoplasm of cervix: Secondary | ICD-10-CM

## 2019-08-04 DIAGNOSIS — E11319 Type 2 diabetes mellitus with unspecified diabetic retinopathy without macular edema: Secondary | ICD-10-CM | POA: Diagnosis not present

## 2019-08-04 DIAGNOSIS — E1169 Type 2 diabetes mellitus with other specified complication: Secondary | ICD-10-CM | POA: Diagnosis not present

## 2019-08-04 DIAGNOSIS — Z23 Encounter for immunization: Secondary | ICD-10-CM | POA: Diagnosis not present

## 2019-08-04 DIAGNOSIS — Z794 Long term (current) use of insulin: Secondary | ICD-10-CM

## 2019-08-04 DIAGNOSIS — E785 Hyperlipidemia, unspecified: Secondary | ICD-10-CM

## 2019-08-04 NOTE — Assessment & Plan Note (Signed)
Followed by opthalmology, stable.

## 2019-08-04 NOTE — Assessment & Plan Note (Signed)
Worsened control with worsened lifestyle.  Will get back on track. Re-eval in 3 months.

## 2019-08-04 NOTE — Assessment & Plan Note (Signed)
Well controlled. Continue current medication.  

## 2019-08-04 NOTE — Patient Instructions (Addendum)
Work on low carb diet and increase exercise.   Please call the location of your choice from the menu below to schedule your Mammogram and/or Bone Density appointment.    [place]   1. [facility]                      Phone:  [phone] [address]                               [place], [place] [address]                                                             Services: Traditional and 3D Mammogram, Bone Density   2. [facility]                 Phone: [phone] [address]                                                       [place], [place] [address]    Service: Bone Density ONLY   *this site does NOT perform mammograms  3. [facility]                        Phone:  [phone] [address]                                  [place], [place] [address]                                            Services:  3D Mammogram and Bone Density    [place]  1. [facility]   Phone:  [phone]   [address]                                                                            [place], [place] [address]                                            Services: 3D Mammogram and Bone Density  2. [facility]  Phone:  [phone]   [address]                        [place], [place] [address]  Services:  3D Mammogram and Bone Density  

## 2019-08-04 NOTE — Progress Notes (Signed)
. Annual Exam   History of Present Illness:  HPI  The patient is here for annual wellness exam and preventative care.  Diabetes: Worsened control, Not at goal on lantus 36 units daily, metformin and victoza 1.8 mcg  She has been under a lot of stress at work.. eating right, minimal exercise.       Lab Results  Component Value Date   HGBA1C 8.0 (H) 07/28/2019   Using medications without difficulties:  Hypoglycemic episodes:  Hyperglycemic episodes:  Feet problems: none Blood Sugars averaging:  eye exam within last year: yes Associated with retinopathy      Wt Readings from Last 3 Encounters:  08/04/19 151 lb 8 oz (68.7 kg)  01/31/19 154 lb 8 oz (70.1 kg)  02/17/18 153 lb 12 oz (69.7 kg)   Body mass index is 25.02 kg/m.  Elevated Cholesterol: At goal on statin.       Lab Results  Component Value Date   CHOL 154 07/28/2019   HDL 71.10 07/28/2019   LDLCALC 70 07/28/2019   LDLDIRECT 127.0 09/10/2011   TRIG 64.0 07/28/2019   CHOLHDL 2 07/28/2019  Using medications without problems:  Muscle aches:  Diet compliance: poor.Marland Kitchen getting back on track.Exercise: Plans to restart swimming.  Other complaints:  This visit occurred during the SARS-CoV-2 public health emergency. Safety protocols were in place, including screening questions prior to the visit, additional usage of staff PPE, and extensive cleaning of exam room while observing appropriate contact time as indicated for disinfecting solutions.  COVID 19 screen: No recent travel or known exposure to COVID19  The patient denies respiratory symptoms of COVID 19 at this time.  The importance of social distancing was discussed today.  ROS      Past Medical History:  Diagnosis Date  . Anemia, unspecified   . Other and unspecified hyperlipidemia   . Palpitations   . Type II or unspecified type diabetes mellitus without mention of complication, not stated as uncontrolled    reports that she has quit smoking. She has never used  smokeless tobacco. She reports current alcohol use. She reports that she does not use drugs.   Current Outpatient Medications:  . atorvastatin (LIPITOR) 20 MG tablet, Take 1 tablet (20 mg total) by mouth daily., Disp: 30 tablet, Rfl: 11  . BD AUTOSHIELD DUO 30G X 5 MM MISC, USE TO INJECT INSULIN TWICE DAILY, Disp: 200 each, Rfl: 3  . Insulin Glargine (LANTUS SOLOSTAR) 100 UNIT/ML Solostar Pen, ADMINISTER 36 UNITS UNDER THE SKIN DAILY, Disp: 15 mL, Rfl: 3  . Insulin Pen Needle 32G X 4 MM MISC, Use to inject insulin twice daily. Dx: E11.9, Disp: 90 each, Rfl: 3  . liraglutide (VICTOZA) 18 MG/3ML SOPN, ADMINISTER 1.8 MG UNDER THE SKIN EVERY DAY, Disp: 27 mL, Rfl: 3  . metFORMIN (GLUCOPHAGE-XR) 500 MG 24 hr tablet, Take 2 tablets (1,000 mg total) by mouth daily with breakfast., Disp: 180 tablet, Rfl: 0  Observations/Objective:  Blood pressure 110/60, pulse 96, temperature 98.9 F (37.2 C), temperature source Temporal, height 5' 5.25" (1.657 m), weight 151 lb 8 oz (68.7 kg), last menstrual period 07/15/2019, SpO2 97 %.  Physical Exam   BP 110/60   Pulse 96   Temp 98.9 F (37.2 C) (Temporal)   Ht 5' 5.25" (1.657 m)   Wt 151 lb 8 oz (68.7 kg)   LMP 07/15/2019   SpO2 97%   BMI 25.02 kg/m   General Appearance:    Alert, cooperative, no distress, appears  stated age  Head:    Normocephalic, without obvious abnormality, atraumatic  Eyes:    PERRL, conjunctiva/corneas clear, EOM's intact, fundi    benign, both eyes  Ears:    Normal TM's and external ear canals, both ears  Nose:   Nares normal, septum midline, mucosa normal, no drainage    or sinus tenderness  Throat:   Lips, mucosa, and tongue normal; teeth and gums normal  Neck:   Supple, symmetrical, trachea midline, no adenopathy;    thyroid:  no enlargement/tenderness/nodules; no carotid   bruit or JVD  Back:     Symmetric, no curvature, ROM normal, no CVA tenderness  Lungs:     Clear to auscultation bilaterally, respirations unlabored   Chest Wall:    No tenderness or deformity   Heart:    Regular rate and rhythm, S1 and S2 normal, no murmur, rub   or gallop  Breast Exam:    No tenderness, masses, or nipple abnormality  Abdomen:     Soft, non-tender, bowel sounds active all four quadrants,    no masses, no organomegaly  Genitalia:    Normal female without lesion, discharge or tenderness  Rectal:    Normal tone, normal prostate, no masses or tenderness;   guaiac negative stool  Extremities:   Extremities normal, atraumatic, no cyanosis or edema  Pulses:   2+ and symmetric all extremities  Skin:   Skin color, texture, turgor normal, no rashes or lesions  Lymph nodes:   Cervical, supraclavicular, and axillary nodes normal  Neurologic:   CNII-XII intact, normal strength, sensation and reflexes    throughout   Assessment and Plan  The patient's preventative maintenance and recommended screening tests for an annual wellness exam were reviewed in full today.  Brought up to date unless services declined.  Counselled on the importance of diet, exercise, and its role in overall health and mortality.  The patient's FH and SH was reviewed, including their home life, tobacco status, and drug and alcohol status.     DVE/PAP: nml pap 2016, neg HPV on q5 schedule  Vaccines: Td DUE, PNA vaccine uptodate. No family ovarian and uterine cancer.  Mammo: Due  Remote former smoker  Refused STD screen.  No early colon cancer in family.   Eliezer Lofts, MD

## 2019-08-08 LAB — CYTOLOGY - PAP
Comment: NEGATIVE
Diagnosis: NEGATIVE
High risk HPV: NEGATIVE

## 2019-08-18 ENCOUNTER — Other Ambulatory Visit: Payer: Self-pay | Admitting: Family Medicine

## 2019-09-07 ENCOUNTER — Other Ambulatory Visit: Payer: Self-pay | Admitting: Family Medicine

## 2019-09-25 ENCOUNTER — Other Ambulatory Visit: Payer: Self-pay | Admitting: Family Medicine

## 2019-11-03 ENCOUNTER — Ambulatory Visit: Payer: BC Managed Care – PPO | Admitting: Family Medicine

## 2019-11-18 DIAGNOSIS — Z20822 Contact with and (suspected) exposure to covid-19: Secondary | ICD-10-CM | POA: Diagnosis not present

## 2019-11-24 ENCOUNTER — Ambulatory Visit: Payer: BC Managed Care – PPO | Admitting: Family Medicine

## 2019-12-08 ENCOUNTER — Ambulatory Visit: Payer: BC Managed Care – PPO | Admitting: Family Medicine

## 2019-12-08 ENCOUNTER — Encounter: Payer: Self-pay | Admitting: Family Medicine

## 2019-12-08 ENCOUNTER — Other Ambulatory Visit: Payer: Self-pay

## 2019-12-08 VITALS — BP 108/60 | HR 94 | Temp 98.6°F | Ht 65.0 in | Wt 159.0 lb

## 2019-12-08 DIAGNOSIS — E119 Type 2 diabetes mellitus without complications: Secondary | ICD-10-CM | POA: Diagnosis not present

## 2019-12-08 DIAGNOSIS — E11319 Type 2 diabetes mellitus with unspecified diabetic retinopathy without macular edema: Secondary | ICD-10-CM | POA: Diagnosis not present

## 2019-12-08 DIAGNOSIS — Z794 Long term (current) use of insulin: Secondary | ICD-10-CM | POA: Diagnosis not present

## 2019-12-08 LAB — POCT GLYCOSYLATED HEMOGLOBIN (HGB A1C): Hemoglobin A1C: 7.4 % — AB (ref 4.0–5.6)

## 2019-12-08 LAB — HM DIABETES FOOT EXAM

## 2019-12-08 NOTE — Patient Instructions (Signed)
Keep working on healthy eating, regular exercise and weight loss.   

## 2019-12-08 NOTE — Progress Notes (Signed)
Chief Complaint  Patient presents with  . Diabetes    History of Present Illness: HPI   Diabetes:   Improved lifestyle, less stress, more active swimming. On Victoza, Lantus, metformin Lab Results  Component Value Date   HGBA1C 7.4 (A) 12/08/2019  Using medications without difficulties: Hypoglycemic episodes: none Hyperglycemic episodes: occ, no clear cause Feet problems: no ulcers Blood Sugars averaging: FBS 115-125,  eye exam within last year: Planned   Wt Readings from Last 3 Encounters:  12/08/19 159 lb (72.1 kg)  08/04/19 151 lb 8 oz (68.7 kg)  01/31/19 154 lb 8 oz (70.1 kg)       This visit occurred during the SARS-CoV-2 public health emergency.  Safety protocols were in place, including screening questions prior to the visit, additional usage of staff PPE, and extensive cleaning of exam room while observing appropriate contact time as indicated for disinfecting solutions.   COVID 19 screen:  No recent travel or known exposure to COVID19 The patient denies respiratory symptoms of COVID 19 at this time. The importance of social distancing was discussed today.     Review of Systems  Constitutional: Negative for chills and fever.  HENT: Negative for congestion and ear pain.   Eyes: Negative for pain and redness.  Respiratory: Negative for cough and shortness of breath.   Cardiovascular: Negative for chest pain, palpitations and leg swelling.  Gastrointestinal: Negative for abdominal pain, blood in stool, constipation, diarrhea, nausea and vomiting.  Genitourinary: Negative for dysuria.  Musculoskeletal: Negative for falls and myalgias.  Skin: Negative for rash.  Neurological: Negative for dizziness.  Psychiatric/Behavioral: Negative for depression. The patient is not nervous/anxious.       Past Medical History:  Diagnosis Date  . Anemia, unspecified   . Other and unspecified hyperlipidemia   . Palpitations   . Type II or unspecified type diabetes mellitus  without mention of complication, not stated as uncontrolled     reports that she has quit smoking. She has never used smokeless tobacco. She reports current alcohol use. She reports that she does not use drugs.   Current Outpatient Medications:  .  atorvastatin (LIPITOR) 20 MG tablet, Take 1 tablet (20 mg total) by mouth daily., Disp: 30 tablet, Rfl: 11 .  BD AUTOSHIELD DUO 30G X 5 MM MISC, USE TO INJECT INSULIN TWICE DAILY, Disp: 200 each, Rfl: 3 .  Insulin Pen Needle (BD PEN NEEDLE NANO U/F) 32G X 4 MM MISC, USE TO INJECT INSULIN TWICE DAILY, Disp: 100 each, Rfl: 11 .  LANTUS SOLOSTAR 100 UNIT/ML Solostar Pen, ADMINISTER 36 UNITS UNDER THE SKIN DAILY, Disp: 15 mL, Rfl: 3 .  liraglutide (VICTOZA) 18 MG/3ML SOPN, ADMINISTER 1.8 MG UNDER THE SKIN EVERY DAY, Disp: 27 mL, Rfl: 3 .  metFORMIN (GLUCOPHAGE-XR) 500 MG 24 hr tablet, TAKE 2 TABLETS(1000 MG) BY MOUTH DAILY WITH BREAKFAST, Disp: 180 tablet, Rfl: 0   Observations/Objective: Blood pressure 108/60, pulse 94, temperature 98.6 F (37 C), temperature source Temporal, height 5\' 5"  (1.651 m), weight 159 lb (72.1 kg), SpO2 99 %.  Physical Exam Constitutional:      General: She is not in acute distress.    Appearance: Normal appearance. She is well-developed. She is not ill-appearing or toxic-appearing.  HENT:     Head: Normocephalic.     Right Ear: Hearing, tympanic membrane, ear canal and external ear normal. Tympanic membrane is not erythematous, retracted or bulging.     Left Ear: Hearing, tympanic membrane, ear canal and external ear  normal. Tympanic membrane is not erythematous, retracted or bulging.     Nose: No mucosal edema or rhinorrhea.     Right Sinus: No maxillary sinus tenderness or frontal sinus tenderness.     Left Sinus: No maxillary sinus tenderness or frontal sinus tenderness.     Mouth/Throat:     Pharynx: Uvula midline.  Eyes:     General: Lids are normal. Lids are everted, no foreign bodies appreciated.      Conjunctiva/sclera: Conjunctivae normal.     Pupils: Pupils are equal, round, and reactive to light.  Neck:     Thyroid: No thyroid mass or thyromegaly.     Vascular: No carotid bruit.     Trachea: Trachea normal.  Cardiovascular:     Rate and Rhythm: Normal rate and regular rhythm.     Pulses: Normal pulses.     Heart sounds: Normal heart sounds, S1 normal and S2 normal. No murmur heard.  No friction rub. No gallop.   Pulmonary:     Effort: Pulmonary effort is normal. No tachypnea or respiratory distress.     Breath sounds: Normal breath sounds. No decreased breath sounds, wheezing, rhonchi or rales.  Abdominal:     General: Bowel sounds are normal.     Palpations: Abdomen is soft.     Tenderness: There is no abdominal tenderness.  Musculoskeletal:     Cervical back: Normal range of motion and neck supple.  Skin:    General: Skin is warm and dry.     Findings: No rash.  Neurological:     Mental Status: She is alert.  Psychiatric:        Mood and Affect: Mood is not anxious or depressed.        Speech: Speech normal.        Behavior: Behavior normal. Behavior is cooperative.        Thought Content: Thought content normal.        Judgment: Judgment normal.    Diabetic foot exam: Normal inspection No skin breakdown No calluses  Normal DP pulses Normal sensation to light touch and monofilament Nails normal   Assessment and Plan Diabetes mellitus type II, controlled, with no complications (HCC) Improving control with increasingly active lifestyle on current regimen.  Diabetic retinopathy associated with controlled type 2 diabetes mellitus (HCC)  Followed by opthalmology.       Kerby Nora, MD

## 2019-12-14 ENCOUNTER — Other Ambulatory Visit: Payer: Self-pay

## 2019-12-14 MED ORDER — METFORMIN HCL ER 500 MG PO TB24: 1000.0000 mg | ORAL_TABLET | Freq: Every day | ORAL | 1 refills | Status: DC

## 2019-12-18 LAB — HM DIABETES EYE EXAM

## 2019-12-25 ENCOUNTER — Encounter: Payer: Self-pay | Admitting: Family Medicine

## 2020-01-10 NOTE — Assessment & Plan Note (Signed)
Followed by opthalmology.       

## 2020-01-10 NOTE — Assessment & Plan Note (Signed)
Improving control with increasingly active lifestyle on current regimen.

## 2020-01-20 ENCOUNTER — Other Ambulatory Visit: Payer: Self-pay | Admitting: Family Medicine

## 2020-03-15 ENCOUNTER — Other Ambulatory Visit: Payer: Self-pay | Admitting: Family Medicine

## 2020-04-19 ENCOUNTER — Other Ambulatory Visit: Payer: Self-pay | Admitting: Family Medicine

## 2020-06-03 ENCOUNTER — Telehealth: Payer: Self-pay | Admitting: *Deleted

## 2020-06-03 ENCOUNTER — Other Ambulatory Visit: Payer: Self-pay | Admitting: *Deleted

## 2020-06-03 LAB — HM DIABETES FOOT EXAM

## 2020-06-03 MED ORDER — INSULIN GLARGINE-YFGN 100 UNIT/ML ~~LOC~~ SOPN: 36.0000 [IU] | PEN_INJECTOR | Freq: Every day | SUBCUTANEOUS | 11 refills | Status: DC

## 2020-06-03 MED ORDER — LANTUS SOLOSTAR 100 UNIT/ML ~~LOC~~ SOPN
PEN_INJECTOR | SUBCUTANEOUS | 3 refills | Status: DC
Start: 2020-06-03 — End: 2020-06-03

## 2020-06-03 NOTE — Telephone Encounter (Signed)
New rx sent to [facility]

## 2020-06-03 NOTE — Telephone Encounter (Signed)
Received faxed from [facility] stating insurance prefers [employer] instead of [employer].  They are asking for a new Rx.

## 2020-06-05 DIAGNOSIS — Z1231 Encounter for screening mammogram for malignant neoplasm of breast: Secondary | ICD-10-CM | POA: Diagnosis not present

## 2020-06-05 LAB — HM MAMMOGRAPHY

## 2020-06-10 ENCOUNTER — Encounter: Payer: Self-pay | Admitting: Family Medicine

## 2020-06-11 ENCOUNTER — Ambulatory Visit: Payer: BC Managed Care – PPO | Admitting: Family Medicine

## 2020-06-25 ENCOUNTER — Other Ambulatory Visit: Payer: Self-pay

## 2020-06-25 ENCOUNTER — Ambulatory Visit: Payer: BC Managed Care – PPO | Admitting: Family Medicine

## 2020-06-25 ENCOUNTER — Encounter: Payer: Self-pay | Admitting: Family Medicine

## 2020-06-25 DIAGNOSIS — E119 Type 2 diabetes mellitus without complications: Secondary | ICD-10-CM

## 2020-06-25 DIAGNOSIS — Z794 Long term (current) use of insulin: Secondary | ICD-10-CM | POA: Diagnosis not present

## 2020-06-25 LAB — POCT GLYCOSYLATED HEMOGLOBIN (HGB A1C): Hemoglobin A1C: 7.9 % — AB (ref 4.0–5.6)

## 2020-06-25 LAB — MICROALBUMIN / CREATININE URINE RATIO
Creatinine,U: 343.9 mg/dL
Microalb Creat Ratio: 0.6 mg/g (ref 0.0–30.0)
Microalb, Ur: 2 mg/dL — ABNORMAL HIGH (ref 0.0–1.9)

## 2020-06-25 MED ORDER — METFORMIN HCL ER 500 MG PO TB24
1000.0000 mg | ORAL_TABLET | Freq: Every day | ORAL | 1 refills | Status: DC
Start: 2020-06-25 — End: 2020-12-27

## 2020-06-25 NOTE — Assessment & Plan Note (Addendum)
Inadequate control on current regimen.. discussed med increase of victoza for weight loss, change of timing of metformin and to get back on track with lifestyle.  Increase dose during menses as  FBS higher. On  glargine insulin 36 units daily,metformin XR 500 mg 2 tabs dail and, victoza 1.8 mg daily

## 2020-06-25 NOTE — Progress Notes (Signed)
Patient ID: [name], female    DOB: Apr 18, 1970, 50 y.o.   MRN: [id number]  This visit was conducted in person.  Ht 5\' 5"  (1.651 m)   BMI 26.46 kg/m    CC: Subjective:   HPI: BLOSSOM CRUME is a 50 y.o. female presenting on 06/25/2020 for Diabetes  Diabetes follow up 6 months...  Control worsening slightly from 7.4 to 7.9 Lab Results  Component Value Date   HGBA1C 7.9 (A) 06/25/2020  On  glargine insulin 36 units daily,metformin XR 500 mg 2 tabs daily and, victoza 1.8 mg daily  ON statin for CAD protection and cholesterol control. Using medications without difficulties: Hypoglycemic episodes: none Hyperglycemic episodes:  occ 260.. usually with cycle... increases 38-42 Units during this time. Feet problems: no ulcers Blood Sugars averaging: FBS 85-180 eye exam within last year: yes  Wt Readings from Last 3 Encounters:  06/25/20 162 lb 6 oz (73.7 kg)  12/08/19 159 lb (72.1 kg)  08/04/19 151 lb 8 oz (68.7 kg)     exercise:  Yard work      Relevant past medical, surgical, family and social history reviewed and updated as indicated. Interim medical history since our last visit reviewed. Allergies and medications reviewed and updated. Outpatient Medications Prior to Visit  Medication Sig Dispense Refill  . atorvastatin (LIPITOR) 20 MG tablet TAKE 1 TABLET(20 MG) BY MOUTH DAILY 30 tablet 5  . BD AUTOSHIELD DUO 30G X 5 MM MISC USE TO INJECT INSULIN TWICE DAILY 200 each 3  . Insulin Glargine-yfgn (SEMGLEE, YFGN,) 100 UNIT/ML SOPN Inject 36 Units into the skin daily. 15 mL 11  . Insulin Pen Needle (BD PEN NEEDLE NANO U/F) 32G X 4 MM MISC USE TO INJECT INSULIN TWICE DAILY 100 each 11  . metFORMIN (GLUCOPHAGE-XR) 500 MG 24 hr tablet Take 2 tablets (1,000 mg total) by mouth daily with breakfast. 180 tablet 1  . VICTOZA 18 MG/3ML SOPN ADMINISTER 1.8 MG UNDER THE SKIN EVERY DAY 27 mL 0   No facility-administered medications prior to visit.     Per HPI unless  specifically indicated in ROS section below Review of Systems  Constitutional: Negative for fatigue and fever.  HENT: Negative for congestion.   Eyes: Negative for pain.  Respiratory: Negative for cough and shortness of breath.   Cardiovascular: Negative for chest pain, palpitations and leg swelling.  Gastrointestinal: Negative for abdominal pain.  Genitourinary: Negative for dysuria and vaginal bleeding.  Musculoskeletal: Negative for back pain.  Neurological: Negative for syncope, light-headedness and headaches.  Psychiatric/Behavioral: Negative for dysphoric mood.   Objective:  Ht 5\' 5"  (1.651 m)   BMI 26.46 kg/m   Wt Readings from Last 3 Encounters:  12/08/19 159 lb (72.1 kg)  08/04/19 151 lb 8 oz (68.7 kg)  01/31/19 154 lb 8 oz (70.1 kg)      Physical Exam Constitutional:      General: She is not in acute distress.    Appearance: Normal appearance. She is well-developed. She is not ill-appearing or toxic-appearing.  HENT:     Head: Normocephalic.     Right Ear: Hearing, tympanic membrane, ear canal and external ear normal. Tympanic membrane is not erythematous, retracted or bulging.     Left Ear: Hearing, tympanic membrane, ear canal and external ear normal. Tympanic membrane is not erythematous, retracted or bulging.     Nose: No mucosal edema or rhinorrhea.     Right Sinus: No maxillary sinus tenderness or frontal sinus tenderness.  Left Sinus: No maxillary sinus tenderness or frontal sinus tenderness.     Mouth/Throat:     Pharynx: Uvula midline.  Eyes:     General: Lids are normal. Lids are everted, no foreign bodies appreciated.     Conjunctiva/sclera: Conjunctivae normal.     Pupils: Pupils are equal, round, and reactive to light.  Neck:     Thyroid: No thyroid mass or thyromegaly.     Vascular: No carotid bruit.     Trachea: Trachea normal.  Cardiovascular:     Rate and Rhythm: Normal rate and regular rhythm.     Pulses: Normal pulses.     Heart sounds:  Normal heart sounds, S1 normal and S2 normal. No murmur heard. No friction rub. No gallop.   Pulmonary:     Effort: Pulmonary effort is normal. No tachypnea or respiratory distress.     Breath sounds: Normal breath sounds. No decreased breath sounds, wheezing, rhonchi or rales.  Abdominal:     General: Bowel sounds are normal.     Palpations: Abdomen is soft.     Tenderness: There is no abdominal tenderness.  Musculoskeletal:     Cervical back: Normal range of motion and neck supple.  Skin:    General: Skin is warm and dry.     Findings: No rash.  Neurological:     Mental Status: She is alert.  Psychiatric:        Mood and Affect: Mood is not anxious or depressed.        Speech: Speech normal.        Behavior: Behavior normal. Behavior is cooperative.        Thought Content: Thought content normal.        Judgment: Judgment normal.    Diabetic foot exam: Normal inspection No skin breakdown No calluses  Normal DP pulses Normal sensation to light touch and monofilament Nails normal     Results for orders placed or performed in visit on 06/10/20  HM MAMMOGRAPHY  Result Value Ref Range   HM Mammogram 0-4 Bi-Rad 0-4 Bi-Rad, Self Reported Normal    This visit occurred during the SARS-CoV-2 public health emergency.  Safety protocols were in place, including screening questions prior to the visit, additional usage of staff PPE, and extensive cleaning of exam room while observing appropriate contact time as indicated for disinfecting solutions.   COVID 19 screen:  No recent travel or known exposure to COVID19 The patient denies respiratory symptoms of COVID 19 at this time. The importance of social distancing was discussed today.   Assessment and Plan Problem List Items Addressed This Visit    Diabetes mellitus type II, controlled, with no complications (HCC)     Inadequate control on current regimen.. discussed med increase of victoza for weight loss, change of timing of  metformin and to get back on track with lifestyle.  Increase dose during menses as  FBS higher. On  glargine insulin 36 units daily,metformin XR 500 mg 2 tabs dail and, victoza 1.8 mg daily      Relevant Medications   metFORMIN (GLUCOPHAGE-XR) 500 MG 24 hr tablet   Other Relevant Orders   POCT glycosylated hemoglobin (Hb A1C) (Completed)   Microalbumin / creatinine urine ratio         Kerby Nora, MD

## 2020-06-25 NOTE — Patient Instructions (Signed)
Ge back on track with regular exercise and continue low carb diet.  Call if interested in  Increasing victoza to 3 mg for weight loss.  Try taking metformin at night and other meds in AM.

## 2020-06-25 NOTE — Addendum Note (Signed)
Addended by: Alvina Chou on: 06/25/2020 09:53 AM   Modules accepted: Orders

## 2020-07-16 ENCOUNTER — Ambulatory Visit: Payer: BC Managed Care – PPO | Admitting: Family Medicine

## 2020-07-16 ENCOUNTER — Other Ambulatory Visit: Payer: Self-pay

## 2020-07-16 VITALS — BP 118/80 | HR 97 | Temp 98.1°F | Ht 65.0 in | Wt 163.0 lb

## 2020-07-16 DIAGNOSIS — E11319 Type 2 diabetes mellitus with unspecified diabetic retinopathy without macular edema: Secondary | ICD-10-CM | POA: Diagnosis not present

## 2020-07-16 DIAGNOSIS — R350 Frequency of micturition: Secondary | ICD-10-CM

## 2020-07-16 LAB — POC URINALSYSI DIPSTICK (AUTOMATED)
Bilirubin, UA: NEGATIVE
Blood, UA: NEGATIVE
Glucose, UA: POSITIVE — AB
Ketones, UA: POSITIVE
Leukocytes, UA: NEGATIVE
Nitrite, UA: NEGATIVE
Protein, UA: NEGATIVE
Spec Grav, UA: 1.025 (ref 1.010–1.025)
Urobilinogen, UA: 0.2 E.U./dL
pH, UA: 6 (ref 5.0–8.0)

## 2020-07-16 NOTE — Patient Instructions (Signed)
Avoid bladder irritants - spicy foods, reduce caffeine, avoid alcohol for 1 week  - Drink 60-80 oz of water or non-caffienated beverage   Urinary Incontinence in Women  Risk Factors - Cannot change: age, pregnancy history, history of hysterectomy - Possible factors you can change: overall health, diuretic use, high impact exercise, weight   General Treatment  1) Appropriate Fluid intake - drink 50-70 oz daily, but spread out how the fluid is consumed 2) Constipation Management - make sure you get fiber in your diet and if constipated a stool softener 3) Weight loss (if overweight) 4) Pelvic Floor strengthening --  ---- Kegel Exercises (practice by holding urine during urination -- feel the pelvic floor muscle contraction). Perform 3 sets of 8-10 contractions working your way to holding each contraction for 10 seconds. Do for 15-20 weeks -----> If you have trouble doing this, try them laying down. Lay down on your back with your feet flat on the ground and knees bent.    Urge Incontinence - Cause: detrusor overactivity (over-active bladder) - Symptoms: lose urine on the way to the bathroom, more frequent trips, urinating overnight - Medications/Substances that worsen: alcohol, caffeine, Diuretics - Treatment >>> Kegel Exercises and Bladder training >>> Bladder Training: Remain stationary when urgency occurs, concentrate on decreasing the urge (Do 3 quick Kegels and 2 deep belly breaths), once controlled walk slowly to the bathroom. Eventually try to wait 30-60 minutes when the urge occurs

## 2020-07-16 NOTE — Progress Notes (Signed)
Subjective:     [name] is a 50 y.o. female presenting for Urinary Frequency (X 4 days ) and Urinary Urgency     HPI   #Frequency - since Friday - urgency - no burning or blood - did have intercourse 1 week before symptoms began - has diabetes - no increased thirst - has been increasing her insulin dose 90-120 am cbg - eats spicy, wine occasionally, 2-3 cups of coffee daily  Lab Results  Component Value Date   HGBA1C 7.9 (A) 06/25/2020      Review of Systems  Constitutional: Negative for chills and fever.  Gastrointestinal: Negative for abdominal pain, nausea and vomiting.  Genitourinary: Positive for frequency and urgency. Negative for difficulty urinating, dysuria, flank pain, hematuria, pelvic pain, vaginal bleeding and vaginal discharge.     Social History   Tobacco Use  Smoking Status Former Smoker  Smokeless Tobacco Never Used        Objective:    BP Readings from Last 3 Encounters:  07/16/20 118/80  06/25/20 110/80  12/08/19 108/60   Wt Readings from Last 3 Encounters:  07/16/20 163 lb (73.9 kg)  06/25/20 162 lb 6 oz (73.7 kg)  12/08/19 159 lb (72.1 kg)    BP 118/80   Pulse 97   Temp 98.1 F (36.7 C) (Temporal)   Ht 5\' 5"  (1.651 m)   Wt 163 lb (73.9 kg)   LMP 06/20/2020 (Exact Date)   SpO2 98%   BMI 27.12 kg/m    Physical Exam Constitutional:      General: She is not in acute distress.    Appearance: She is well-developed. She is not diaphoretic.  HENT:     Head: Normocephalic and atraumatic.  Eyes:     Conjunctiva/sclera: Conjunctivae normal.  Cardiovascular:     Rate and Rhythm: Normal rate and regular rhythm.     Heart sounds: Normal heart sounds.  Pulmonary:     Effort: Pulmonary effort is normal.  Abdominal:     General: Bowel sounds are normal. There is no distension.     Palpations: Abdomen is soft.     Tenderness: There is no abdominal tenderness. There is no guarding.  Musculoskeletal:     Cervical back:  Neck supple.  Skin:    General: Skin is warm and dry.  Neurological:     Mental Status: She is alert.      UA: glucose and ketones, no LE or nitrites    Assessment & Plan:   Problem List Items Addressed This Visit      Endocrine   Diabetic retinopathy associated with controlled type 2 diabetes mellitus (HCC)    Improved control. Discussed increasing hydration with ketones/glucose in urine. But per home CBG seems to have improved control.        Other Visit Diagnoses    Urinary frequency    -  Primary   Relevant Orders   POCT Urinalysis Dipstick (Automated) (Completed)     UA w/o signs of UTI. Discussed common bladder irritants to avoid and to increase hydration. Normal to urinate 6 times per day.   If not improving advised kegels and return if still no improvement  Call if burning or new symptoms.   Return if symptoms worsen or fail to improve.  06/22/2020, MD  This visit occurred during the SARS-CoV-2 public health emergency.  Safety protocols were in place, including screening questions prior to the visit, additional usage of staff PPE, and extensive cleaning  of exam room while observing appropriate contact time as indicated for disinfecting solutions.

## 2020-07-16 NOTE — Assessment & Plan Note (Addendum)
Improved control. Discussed increasing hydration with ketones/glucose in urine. But per home CBG seems to have improved control. Cont to adjust insulin as needed on 38 units currently. Cont metformin

## 2020-08-01 ENCOUNTER — Other Ambulatory Visit: Payer: Self-pay | Admitting: Family Medicine

## 2020-08-02 NOTE — Telephone Encounter (Signed)
Pharmacy requests refill on: Victoza 18 mg/3 mL   LAST REFILL: 04/19/2020 (Q-27 mL, R-0) LAST OV: 06/25/2020 NEXT OV: 12/26/2020 PHARMACY: [id number] [address], [place]  Hgb A1C (06/25/2020): 7.9

## 2020-09-19 ENCOUNTER — Other Ambulatory Visit: Payer: Self-pay | Admitting: Family Medicine

## 2020-11-05 ENCOUNTER — Other Ambulatory Visit: Payer: Self-pay | Admitting: Family Medicine

## 2020-11-20 ENCOUNTER — Other Ambulatory Visit: Payer: Self-pay | Admitting: Family Medicine

## 2020-11-20 DIAGNOSIS — E11319 Type 2 diabetes mellitus with unspecified diabetic retinopathy without macular edema: Secondary | ICD-10-CM

## 2020-11-20 DIAGNOSIS — Z794 Long term (current) use of insulin: Secondary | ICD-10-CM

## 2020-11-20 MED ORDER — DEXCOM G6 SENSOR MISC: 11 refills | Status: DC

## 2020-12-04 ENCOUNTER — Telehealth: Payer: Self-pay | Admitting: Family Medicine

## 2020-12-04 DIAGNOSIS — Z794 Long term (current) use of insulin: Secondary | ICD-10-CM

## 2020-12-04 DIAGNOSIS — E119 Type 2 diabetes mellitus without complications: Secondary | ICD-10-CM

## 2020-12-04 DIAGNOSIS — Z1159 Encounter for screening for other viral diseases: Secondary | ICD-10-CM

## 2020-12-04 NOTE — Telephone Encounter (Signed)
-----   Message from Alvina Chou sent at 11/26/2020  7:46 AM EDT ----- Regarding: Lab orders for Thursday, 10.20.22 Patient is scheduled for CPX labs, please order future labs, Thanks , Camelia Eng

## 2020-12-19 ENCOUNTER — Other Ambulatory Visit: Payer: BC Managed Care – PPO

## 2020-12-26 ENCOUNTER — Encounter: Payer: BC Managed Care – PPO | Admitting: Family Medicine

## 2020-12-27 ENCOUNTER — Other Ambulatory Visit: Payer: Self-pay | Admitting: Family Medicine

## 2021-01-07 ENCOUNTER — Other Ambulatory Visit: Payer: Self-pay

## 2021-01-07 ENCOUNTER — Ambulatory Visit (INDEPENDENT_AMBULATORY_CARE_PROVIDER_SITE_OTHER): Payer: BC Managed Care – PPO | Admitting: Family Medicine

## 2021-01-07 VITALS — BP 118/76 | HR 96 | Temp 98.2°F | Resp 12 | Ht 65.5 in | Wt 173.2 lb

## 2021-01-07 DIAGNOSIS — Z0000 Encounter for general adult medical examination without abnormal findings: Secondary | ICD-10-CM

## 2021-01-07 DIAGNOSIS — Z1211 Encounter for screening for malignant neoplasm of colon: Secondary | ICD-10-CM | POA: Diagnosis not present

## 2021-01-07 DIAGNOSIS — Z1159 Encounter for screening for other viral diseases: Secondary | ICD-10-CM | POA: Diagnosis not present

## 2021-01-07 DIAGNOSIS — E119 Type 2 diabetes mellitus without complications: Secondary | ICD-10-CM

## 2021-01-07 DIAGNOSIS — E11319 Type 2 diabetes mellitus with unspecified diabetic retinopathy without macular edema: Secondary | ICD-10-CM

## 2021-01-07 DIAGNOSIS — E1169 Type 2 diabetes mellitus with other specified complication: Secondary | ICD-10-CM

## 2021-01-07 DIAGNOSIS — Z794 Long term (current) use of insulin: Secondary | ICD-10-CM

## 2021-01-07 DIAGNOSIS — E785 Hyperlipidemia, unspecified: Secondary | ICD-10-CM

## 2021-01-07 NOTE — Progress Notes (Signed)
Patient ID: [name], female    DOB: December 02, 1970, 50 y.o.   MRN: [id number]  This visit was conducted in person.  BP 118/76   Pulse 96   Temp 98.2 F (36.8 C)   Resp 12   Ht 5' 5.5" (1.664 m)   Wt 173 lb 4 oz (78.6 kg)   LMP 01/06/2021   SpO2 97%   BMI 28.39 kg/m    CC: Chief Complaint  Patient presents with   Annual Exam    Subjective:   HPI: [name] is a 50 y.o. female presenting on 01/07/2021 for Annual Exam   Diabetes:  Due for re-eval... has been using Dexcom in last month...has noted elevation over night occ 220 ON metfomrin, on victoza and on semglee Using medications without difficulties: Hypoglycemic episodes: none Hyperglycemic episodes: yes Feet problems: no uclers  Blood Sugars averaging: FBS 120 eye exam within last year: scheduled.  Wt Readings from Last 3 Encounters:  01/07/21 173 lb 4 oz (78.6 kg)  07/16/20 163 lb (73.9 kg)  06/25/20 162 lb 6 oz (73.7 kg)     Elevated Cholesterol: On atorvastatin 20 mg daily Using medications without problems: Muscle aches:  Diet compliance: moderate Exercise: planning yoga class. Other complaints:   Relevant past medical, surgical, family and social history reviewed and updated as indicated. Interim medical history since our last visit reviewed. Allergies and medications reviewed and updated. Outpatient Medications Prior to Visit  Medication Sig Dispense Refill   atorvastatin (LIPITOR) 20 MG tablet Take 1 tablet (20 mg total) by mouth daily. 90 tablet 1   BD AUTOSHIELD DUO 30G X 5 MM MISC USE TO INJECT INSULIN TWICE DAILY 200 each 3   Continuous Blood Gluc Sensor (DEXCOM G6 SENSOR) MISC Check blood sugar continuously. Dx  E.11.319 3 each 11   Insulin Glargine-yfgn (SEMGLEE, YFGN,) 100 UNIT/ML SOPN Inject 36 Units into the skin daily. (Patient taking differently: Inject 40-42 Units into the skin daily.) 15 mL 11   Insulin Pen Needle (BD PEN NEEDLE NANO U/F) 32G X 4 MM MISC USE TO INJECT  INSULIN TWICE DAILY 100 each 11   metFORMIN (GLUCOPHAGE-XR) 500 MG 24 hr tablet Take 2 tablets (1,000 mg total) by mouth daily with breakfast. 180 tablet 1   VICTOZA 18 MG/3ML SOPN ADMINISTER 1.8 MG UNDER THE SKIN EVERY DAY 27 mL 0   No facility-administered medications prior to visit.     Per HPI unless specifically indicated in ROS section below Review of Systems  Constitutional:  Negative for fatigue and fever.  HENT:  Negative for congestion.   Eyes:  Negative for pain.  Respiratory:  Negative for cough and shortness of breath.   Cardiovascular:  Negative for chest pain, palpitations and leg swelling.  Gastrointestinal:  Negative for abdominal pain.  Genitourinary:  Negative for dysuria and vaginal bleeding.  Musculoskeletal:  Negative for back pain.  Neurological:  Negative for syncope, light-headedness and headaches.  Psychiatric/Behavioral:  Negative for dysphoric mood.   Objective:  BP 118/76   Pulse 96   Temp 98.2 F (36.8 C)   Resp 12   Ht 5' 5.5" (1.664 m)   Wt 173 lb 4 oz (78.6 kg)   LMP 01/06/2021   SpO2 97%   BMI 28.39 kg/m   Wt Readings from Last 3 Encounters:  01/07/21 173 lb 4 oz (78.6 kg)  07/16/20 163 lb (73.9 kg)  06/25/20 162 lb 6 oz (73.7 kg)      Physical  Exam Vitals and nursing note reviewed.  Constitutional:      General: She is not in acute distress.    Appearance: Normal appearance. She is well-developed. She is not ill-appearing or toxic-appearing.  HENT:     Head: Normocephalic.     Right Ear: Hearing, tympanic membrane, ear canal and external ear normal.     Left Ear: Hearing, tympanic membrane, ear canal and external ear normal.     Nose: Nose normal.  Eyes:     General: Lids are normal. Lids are everted, no foreign bodies appreciated.     Conjunctiva/sclera: Conjunctivae normal.     Pupils: Pupils are equal, round, and reactive to light.  Neck:     Thyroid: No thyroid mass or thyromegaly.     Vascular: No carotid bruit.      Trachea: Trachea normal.  Cardiovascular:     Rate and Rhythm: Normal rate and regular rhythm.     Heart sounds: Normal heart sounds, S1 normal and S2 normal. No murmur heard.   No gallop.  Pulmonary:     Effort: Pulmonary effort is normal. No respiratory distress.     Breath sounds: Normal breath sounds. No wheezing, rhonchi or rales.  Abdominal:     General: Bowel sounds are normal. There is no distension or abdominal bruit.     Palpations: Abdomen is soft. There is no fluid wave or mass.     Tenderness: There is no abdominal tenderness. There is no guarding or rebound.     Hernia: No hernia is present.  Musculoskeletal:     Cervical back: Normal range of motion and neck supple.  Lymphadenopathy:     Cervical: No cervical adenopathy.  Skin:    General: Skin is warm and dry.     Findings: No rash.  Neurological:     Mental Status: She is alert.     Cranial Nerves: No cranial nerve deficit.     Sensory: No sensory deficit.  Psychiatric:        Mood and Affect: Mood is not anxious or depressed.        Speech: Speech normal.        Behavior: Behavior normal. Behavior is cooperative.        Judgment: Judgment normal.      Results for orders placed or performed in visit on 07/16/20  POCT Urinalysis Dipstick (Automated)  Result Value Ref Range   Color, UA yellow    Clarity, UA cloudy    Glucose, UA Positive (A) Negative   Bilirubin, UA negative    Ketones, UA positive    Spec Grav, UA 1.025 1.010 - 1.025   Blood, UA negative    pH, UA 6.0 5.0 - 8.0   Protein, UA Negative Negative   Urobilinogen, UA 0.2 0.2 or 1.0 E.U./dL   Nitrite, UA negative    Leukocytes, UA Negative Negative    This visit occurred during the SARS-CoV-2 public health emergency.  Safety protocols were in place, including screening questions prior to the visit, additional usage of staff PPE, and extensive cleaning of exam room while observing appropriate contact time as indicated for disinfecting  solutions.   COVID 19 screen:  No recent travel or known exposure to COVID19 The patient denies respiratory symptoms of COVID 19 at this time. The importance of social distancing was discussed today.   Assessment and Plan   The patient's preventative maintenance and recommended screening tests for an annual wellness exam were reviewed in full today.  Brought up to date unless services declined.  Counselled on the importance of diet, exercise, and its role in overall health and mortality. The patient's FH and SH was reviewed, including their home life, tobacco status, and drug and alcohol status.   DVE/PAP: nml pap 2021, neg HPV on q5 schedule  Vaccines: Td, flu uptodate. PNA vaccine uptodate. No family ovarian and uterine cancer.  Mammo: 05/2020 Remote former smoker  Refused STD screen.  No early colon cancer in family, due for colon cancer screening. Will start with ifob.    Check Hep C   Problem List Items Addressed This Visit     Diabetes mellitus type II, controlled, with no complications (HCC)    Stable, chronic.  Continue current medication.   Due for re-eval.      Diabetic retinopathy associated with controlled type 2 diabetes mellitus (HCC)    Associated with diabetes, followed by opthalmology.      Hyperlipidemia associated with type 2 diabetes mellitus (HCC)    Stable, chronic.  Continue current medication.   Due for re-eval.      Other Visit Diagnoses     Routine general medical examination at a health care facility    -  Primary   Colon cancer screening       Relevant Orders   Fecal occult blood, imunochemical   Need for hepatitis C screening test              Orders Released This Visit (4)                               Comprehensive metabolic panel - Released 01/07/2021 2:49 PM                         Routine, Clinic Collect                               Hemoglobin A1c - Released 01/07/2021 2:49 PM                         Routine, Clinic Collect                        Hepatitis C antibody - Released 01/07/2021 2:49 PM                         Routine, Clinic Collect               Lipid panel - Released 01/07/2021 2:49 PM                         Routine, Clinic Collect     Kerby Nora, MD

## 2021-01-07 NOTE — Patient Instructions (Addendum)
Please stop at the lab to have labs drawn.  Pick up ifob test in lab for colon cancer.

## 2021-01-08 LAB — COMPREHENSIVE METABOLIC PANEL
ALT: 13 U/L (ref 0–35)
AST: 14 U/L (ref 0–37)
Albumin: 4.1 g/dL (ref 3.5–5.2)
Alkaline Phosphatase: 58 U/L (ref 39–117)
BUN: 11 mg/dL (ref 6–23)
CO2: 26 mEq/L (ref 19–32)
Calcium: 9.1 mg/dL (ref 8.4–10.5)
Chloride: 102 mEq/L (ref 96–112)
Creatinine, Ser: 0.59 mg/dL (ref 0.40–1.20)
GFR: 106.8 mL/min (ref >60.00)
Glucose, Bld: 269 mg/dL — ABNORMAL HIGH (ref 70–99)
Potassium: 3.9 mEq/L (ref 3.5–5.1)
Sodium: 136 mEq/L (ref 135–145)
Total Bilirubin: 0.7 mg/dL (ref 0.2–1.2)
Total Protein: 6.9 g/dL (ref 6.0–8.3)

## 2021-01-08 LAB — LIPID PANEL
Cholesterol: 160 mg/dL (ref 0–200)
HDL: 64.1 mg/dL (ref >39.00)
LDL Cholesterol: 68 mg/dL (ref 0–99)
NonHDL: 95.71
Total CHOL/HDL Ratio: 2
Triglycerides: 140 mg/dL (ref 0.0–149.0)
VLDL: 28 mg/dL (ref 0.0–40.0)

## 2021-01-08 LAB — HEMOGLOBIN A1C: Hgb A1c MFr Bld: 8.7 % — ABNORMAL HIGH (ref 4.6–6.5)

## 2021-01-08 LAB — HEPATITIS C ANTIBODY
Hepatitis C Ab: NONREACTIVE
SIGNAL TO CUT-OFF: 0.05 (ref <1.00)

## 2021-02-11 LAB — HM DIABETES EYE EXAM

## 2021-02-14 ENCOUNTER — Other Ambulatory Visit: Payer: Self-pay | Admitting: Family Medicine

## 2021-02-14 ENCOUNTER — Encounter: Payer: Self-pay | Admitting: Family Medicine

## 2021-02-14 MED ORDER — DEXCOM G6 TRANSMITTER MISC: 11 refills | Status: DC

## 2021-02-17 ENCOUNTER — Encounter: Payer: Self-pay | Admitting: Family Medicine

## 2021-02-18 NOTE — Assessment & Plan Note (Signed)
Associated with diabetes, followed by opthalmology.

## 2021-02-18 NOTE — Assessment & Plan Note (Signed)
Stable, chronic.  Continue current medication.   Due for re-eval.

## 2021-04-09 ENCOUNTER — Encounter: Payer: Self-pay | Admitting: Family Medicine

## 2021-04-10 MED ORDER — INSULIN GLARGINE-YFGN 100 UNIT/ML ~~LOC~~ SOPN: 47.0000 [IU] | PEN_INJECTOR | Freq: Every day | SUBCUTANEOUS | 11 refills | Status: DC

## 2021-06-11 DIAGNOSIS — Z1231 Encounter for screening mammogram for malignant neoplasm of breast: Secondary | ICD-10-CM | POA: Diagnosis not present

## 2021-06-22 ENCOUNTER — Other Ambulatory Visit: Payer: Self-pay | Admitting: Family Medicine

## 2021-06-30 LAB — HM DIABETES FOOT EXAM

## 2021-07-07 DIAGNOSIS — R922 Inconclusive mammogram: Secondary | ICD-10-CM | POA: Diagnosis not present

## 2021-07-07 DIAGNOSIS — R928 Other abnormal and inconclusive findings on diagnostic imaging of breast: Secondary | ICD-10-CM | POA: Diagnosis not present

## 2021-07-07 LAB — HM MAMMOGRAPHY

## 2021-07-08 ENCOUNTER — Encounter: Payer: Self-pay | Admitting: *Deleted

## 2021-07-11 ENCOUNTER — Ambulatory Visit: Payer: BC Managed Care – PPO | Admitting: Family Medicine

## 2021-07-11 VITALS — BP 120/80 | HR 102 | Temp 97.4°F | Ht 65.5 in | Wt 177.4 lb

## 2021-07-11 DIAGNOSIS — E119 Type 2 diabetes mellitus without complications: Secondary | ICD-10-CM

## 2021-07-11 DIAGNOSIS — Z794 Long term (current) use of insulin: Secondary | ICD-10-CM | POA: Diagnosis not present

## 2021-07-11 DIAGNOSIS — E11319 Type 2 diabetes mellitus with unspecified diabetic retinopathy without macular edema: Secondary | ICD-10-CM

## 2021-07-11 LAB — POCT GLYCOSYLATED HEMOGLOBIN (HGB A1C): Hemoglobin A1C: 8.1 % — AB (ref 4.0–5.6)

## 2021-07-11 MED ORDER — OZEMPIC (0.25 OR 0.5 MG/DOSE) 2 MG/1.5ML ~~LOC~~ SOPN: 0.2500 mg | PEN_INJECTOR | SUBCUTANEOUS | 11 refills | Status: DC

## 2021-07-11 NOTE — Patient Instructions (Addendum)
Stop Victoza. ? Start Ozempic 0.25 mg weekly, if tolerating.. increase to 0.5 mg weekly. ? Continue other medications. ? Work on exercise and healthy eating. ?  ? ?

## 2021-07-11 NOTE — Progress Notes (Signed)
Patient ID: [name], female    DOB: 11-Nov-1970, 51 y.o.   MRN: [id number]  This visit was conducted in person.  BP 120/80   Pulse (!) 102   Temp (!) 97.4 F (36.3 C) (Temporal)   Ht 5' 5.5" (1.664 m)   Wt 177 lb 6 oz (80.5 kg)   LMP 06/14/2021 (Exact Date)   SpO2 99%   BMI 29.07 kg/m    CC:  Chief Complaint  Patient presents with   Follow-up    6 mo DM     Subjective:   HPI: Julie SheriffJanet N Bowland is a 51 y.o. female presenting on 07/11/2021 for Follow-up (6 mo DM )  Diabetes:  inadequate control ,using Dexcom to monitor blood sugars.  On metformin 500 mg xr 2 tabs daily, on victoza 1.8 mg daily and on semglee 47 units daily Lab Results  Component Value Date   HGBA1C 8.1 (A) 07/11/2021  Using medications without difficulties: Hypoglycemic episodes: occ 75 after gym. Hyperglycemic episodes: yes Feet problems: no ulcers Blood Sugars averaging: FBS 150-180 eye exam within last year: retinopathy followed by  ophthalmology 01/2021   2 months ago started back at gym .. 3 days week.    Wt Readings from Last 3 Encounters:  07/11/21 177 lb 6 oz (80.5 kg)  01/07/21 173 lb 4 oz (78.6 kg)  07/16/20 163 lb (73.9 kg)     Due for urine microalbuminuria.    Wt Readings from Last 3 Encounters:  07/11/21 177 lb 6 oz (80.5 kg)  01/07/21 173 lb 4 oz (78.6 kg)  07/16/20 163 lb (73.9 kg)    Relevant past medical, surgical, family and social history reviewed and updated as indicated. Interim medical history since our last visit reviewed. Allergies and medications reviewed and updated. Outpatient Medications Prior to Visit  Medication Sig Dispense Refill   atorvastatin (LIPITOR) 20 MG tablet TAKE 1 TABLET(20 MG) BY MOUTH DAILY 90 tablet 1   BD AUTOSHIELD DUO 30G X 5 MM MISC USE TO INJECT INSULIN TWICE DAILY 200 each 3   Continuous Blood Gluc Sensor (DEXCOM G6 SENSOR) MISC Check blood sugar continuously. Dx  E.11.319 3 each 11   Continuous Blood Gluc Transmit (DEXCOM G6  TRANSMITTER) MISC Check sugar continuously 3 each 11   insulin glargine-yfgn (SEMGLEE, YFGN,) 100 UNIT/ML Pen Inject 47 Units into the skin daily. 15 mL 11   Insulin Pen Needle (BD PEN NEEDLE NANO U/F) 32G X 4 MM MISC USE TO INJECT INSULIN TWICE DAILY 100 each 11   metFORMIN (GLUCOPHAGE-XR) 500 MG 24 hr tablet TAKE 2 TABLETS(1000 MG) BY MOUTH DAILY WITH BREAKFAST 180 tablet 1   VICTOZA 18 MG/3ML SOPN ADMINISTER 1.8 MG UNDER THE SKIN EVERY DAY 27 mL 3   No facility-administered medications prior to visit.     Per HPI unless specifically indicated in ROS section below Review of Systems  Constitutional:  Negative for fatigue and fever.  HENT:  Negative for congestion.   Eyes:  Negative for pain.  Respiratory:  Negative for cough and shortness of breath.   Cardiovascular:  Negative for chest pain, palpitations and leg swelling.  Gastrointestinal:  Negative for abdominal pain.  Genitourinary:  Negative for dysuria and vaginal bleeding.  Musculoskeletal:  Negative for back pain.  Neurological:  Negative for syncope, light-headedness and headaches.  Psychiatric/Behavioral:  Negative for dysphoric mood.   Objective:  BP 120/80   Pulse (!) 102   Temp (!) 97.4 F (36.3 C) (Temporal)  Ht 5' 5.5" (1.664 m)   Wt 177 lb 6 oz (80.5 kg)   LMP 06/14/2021 (Exact Date)   SpO2 99%   BMI 29.07 kg/m   Wt Readings from Last 3 Encounters:  07/11/21 177 lb 6 oz (80.5 kg)  01/07/21 173 lb 4 oz (78.6 kg)  07/16/20 163 lb (73.9 kg)      Physical Exam Constitutional:      General: She is not in acute distress.    Appearance: Normal appearance. She is well-developed. She is not ill-appearing or toxic-appearing.  HENT:     Head: Normocephalic.     Right Ear: Hearing, tympanic membrane, ear canal and external ear normal. Tympanic membrane is not erythematous, retracted or bulging.     Left Ear: Hearing, tympanic membrane, ear canal and external ear normal. Tympanic membrane is not erythematous,  retracted or bulging.     Nose: No mucosal edema or rhinorrhea.     Right Sinus: No maxillary sinus tenderness or frontal sinus tenderness.     Left Sinus: No maxillary sinus tenderness or frontal sinus tenderness.     Mouth/Throat:     Pharynx: Uvula midline.  Eyes:     General: Lids are normal. Lids are everted, no foreign bodies appreciated.     Conjunctiva/sclera: Conjunctivae normal.     Pupils: Pupils are equal, round, and reactive to light.  Neck:     Thyroid: No thyroid mass or thyromegaly.     Vascular: No carotid bruit.     Trachea: Trachea normal.  Cardiovascular:     Rate and Rhythm: Normal rate and regular rhythm.     Pulses: Normal pulses.     Heart sounds: Normal heart sounds, S1 normal and S2 normal. No murmur heard.    No friction rub. No gallop.  Pulmonary:     Effort: Pulmonary effort is normal. No tachypnea or respiratory distress.     Breath sounds: Normal breath sounds. No decreased breath sounds, wheezing, rhonchi or rales.  Abdominal:     General: Bowel sounds are normal.     Palpations: Abdomen is soft.     Tenderness: There is no abdominal tenderness.  Musculoskeletal:     Cervical back: Normal range of motion and neck supple.  Skin:    General: Skin is warm and dry.     Findings: No rash.  Neurological:     Mental Status: She is alert.  Psychiatric:        Mood and Affect: Mood is not anxious or depressed.        Speech: Speech normal.        Behavior: Behavior normal. Behavior is cooperative.        Thought Content: Thought content normal.        Judgment: Judgment normal.      Diabetic foot exam: Normal inspection No skin breakdown No calluses  Normal DP pulses Normal sensation to light touch and monofilament Nails normal  Results for orders placed or performed in visit on 07/11/21  POCT glycosylated hemoglobin (Hb A1C)  Result Value Ref Range   Hemoglobin A1C 8.1 (A) 4.0 - 5.6 %   HbA1c POC (<> result, manual entry)     HbA1c, POC  (prediabetic range)     HbA1c, POC (controlled diabetic range)       COVID 19 screen:  No recent travel or known exposure to COVID19 The patient denies respiratory symptoms of COVID 19 at this time. The importance of social distancing was discussed  today.   Assessment and Plan    Problem List Items Addressed This Visit     Diabetes mellitus type 2 with retinopathy (HCC) - Primary    Chronic, adequate control.  Stop Victoza. Start Ozempic 0.25 mg weekly, if tolerating.. increase to 0.5 mg weekly. Continue other medications: metformin 500 mg xr 2 tabs daily,and  semglee 47 units daily Work on exercise and healthy eating.   Due for urine microalbuminuria.      Relevant Orders   Microalbumin / creatinine urine ratio (Completed)   Diabetic retinopathy associated with controlled type 2 diabetes mellitus (HCC)     Appears to have resolved on eye exam per notes.       Meds ordered this encounter  Medications   DISCONTD: Semaglutide,0.25 or 0.5MG /DOS, (OZEMPIC, 0.25 OR 0.5 MG/DOSE,) 2 MG/1.5ML SOPN    Sig: Inject 0.25 mg into the skin once a week.    Dispense:  1.5 mL    Refill:  11    Orders Placed This Encounter  Procedures   Microalbumin / creatinine urine ratio    Standing Status:   Future    Number of Occurrences:   1    Standing Expiration Date:   07/11/2022   POCT glycosylated hemoglobin (Hb A1C)   HM DIABETES FOOT EXAM    This external order was created through the Results Console.     Kerby Nora, MD

## 2021-07-11 NOTE — Assessment & Plan Note (Signed)
Appears to have resolved on eye exam per notes. ?

## 2021-07-12 LAB — MICROALBUMIN / CREATININE URINE RATIO
Creatinine, Urine: 96 mg/dL (ref 20–275)
Microalb Creat Ratio: 5 mcg/mg creat (ref <30)
Microalb, Ur: 0.5 mg/dL

## 2021-07-16 ENCOUNTER — Encounter: Payer: Self-pay | Admitting: Family Medicine

## 2021-08-08 ENCOUNTER — Ambulatory Visit: Payer: BC Managed Care – PPO | Admitting: Family Medicine

## 2021-08-08 VITALS — BP 120/72 | HR 88 | Temp 97.4°F | Resp 12 | Ht 65.5 in | Wt 175.0 lb

## 2021-08-08 DIAGNOSIS — E119 Type 2 diabetes mellitus without complications: Secondary | ICD-10-CM | POA: Diagnosis not present

## 2021-08-08 DIAGNOSIS — E11319 Type 2 diabetes mellitus with unspecified diabetic retinopathy without macular edema: Secondary | ICD-10-CM

## 2021-08-08 DIAGNOSIS — Z794 Long term (current) use of insulin: Secondary | ICD-10-CM | POA: Diagnosis not present

## 2021-08-08 MED ORDER — SEMAGLUTIDE (1 MG/DOSE) 4 MG/3ML ~~LOC~~ SOPN: 1.0000 mg | PEN_INJECTOR | SUBCUTANEOUS | 11 refills | Status: DC

## 2021-08-08 NOTE — Assessment & Plan Note (Signed)
Chronic, still fluctuating blood sugars and minimal weight loss on Ozempic at the 0.5 mg weekly dose.  She feels that her blood sugars are better controlled on the Ozempic compared to the Victoza.  She does have some additional side effects similar to how she felt when she first started Victoza.  She will increase once her current dose of Ozempic is out to 1 mg weekly.  She will continue working on exercise, heart healthy diet and weight loss.

## 2021-08-08 NOTE — Patient Instructions (Signed)
Increase semaglutide to 1 mg weekly.

## 2021-08-08 NOTE — Progress Notes (Signed)
Patient ID: [name], female    DOB: 1971-02-18, 51 y.o.   MRN: [id number]  This visit was conducted in person.  BP 120/72   Pulse 88   Temp (!) 97.4 F (36.3 C)   Resp 12   Ht 5' 5.5" (1.664 m)   Wt 175 lb (79.4 kg)   LMP 06/14/2021 (Exact Date)   SpO2 99%   BMI 28.68 kg/m    CC:  Chief Complaint  Patient presents with   medication follow up    On Ozempic 0.5 dose now that she started about 2 weeks ago    Subjective:   HPI: [name] is a 51 y.o. female presenting on 08/08/2021 for medication follow up (On Ozempic 0.5 dose now that she started about 2 weeks ago)   Stopped victoza and started ozempic on 07/11/2021... increased to -0.5 mg weekly 2 weeks ago.  She reports her blood sugars   on CGM Fasting 130-140 ,  occ 230-250 after  eating.  No hypoglycemia.  She has had some mild nausea, tolerable. Feeling fullness, decrease appetite.  No abdominal pain, no fever. Wt Readings from Last 3 Encounters:  08/08/21 175 lb (79.4 kg)  07/11/21 177 lb 6 oz (80.5 kg)  01/07/21 173 lb 4 oz (78.6 kg)      Relevant past medical, surgical, family and social history reviewed and updated as indicated. Interim medical history since our last visit reviewed. Allergies and medications reviewed and updated. Outpatient Medications Prior to Visit  Medication Sig Dispense Refill   atorvastatin (LIPITOR) 20 MG tablet TAKE 1 TABLET(20 MG) BY MOUTH DAILY 90 tablet 1   BD AUTOSHIELD DUO 30G X 5 MM MISC USE TO INJECT INSULIN TWICE DAILY 200 each 3   Continuous Blood Gluc Sensor (DEXCOM G6 SENSOR) MISC Check blood sugar continuously. Dx  E.11.319 3 each 11   Continuous Blood Gluc Transmit (DEXCOM G6 TRANSMITTER) MISC Check sugar continuously 3 each 11   insulin glargine-yfgn (SEMGLEE, YFGN,) 100 UNIT/ML Pen Inject 47 Units into the skin daily. 15 mL 11   Insulin Pen Needle (BD PEN NEEDLE NANO U/F) 32G X 4 MM MISC USE TO INJECT INSULIN TWICE DAILY 100 each 11   metFORMIN  (GLUCOPHAGE-XR) 500 MG 24 hr tablet TAKE 2 TABLETS(1000 MG) BY MOUTH DAILY WITH BREAKFAST 180 tablet 1   Semaglutide,0.25 or 0.5MG /DOS, (OZEMPIC, 0.25 OR 0.5 MG/DOSE,) 2 MG/1.5ML SOPN Inject 0.25 mg into the skin once a week. 1.5 mL 11   No facility-administered medications prior to visit.     Per HPI unless specifically indicated in ROS section below Review of Systems  Constitutional:  Negative for fatigue and fever.  HENT:  Negative for congestion.   Eyes:  Negative for pain.  Respiratory:  Negative for cough and shortness of breath.   Cardiovascular:  Negative for chest pain, palpitations and leg swelling.  Gastrointestinal:  Negative for abdominal pain.  Genitourinary:  Negative for dysuria and vaginal bleeding.  Musculoskeletal:  Negative for back pain.  Neurological:  Negative for syncope, light-headedness and headaches.  Psychiatric/Behavioral:  Negative for dysphoric mood.    Objective:  BP 120/72   Pulse 88   Temp (!) 97.4 F (36.3 C)   Resp 12   Ht 5' 5.5" (1.664 m)   Wt 175 lb (79.4 kg)   LMP 06/14/2021 (Exact Date)   SpO2 99%   BMI 28.68 kg/m   Wt Readings from Last 3 Encounters:  08/08/21 175 lb (79.4 kg)  07/11/21 177 lb 6 oz (80.5 kg)  01/07/21 173 lb 4 oz (78.6 kg)      Physical Exam Constitutional:      General: She is not in acute distress.    Appearance: Normal appearance. She is well-developed. She is not ill-appearing or toxic-appearing.  HENT:     Head: Normocephalic.     Right Ear: Hearing, tympanic membrane, ear canal and external ear normal. Tympanic membrane is not erythematous, retracted or bulging.     Left Ear: Hearing, tympanic membrane, ear canal and external ear normal. Tympanic membrane is not erythematous, retracted or bulging.     Nose: No mucosal edema or rhinorrhea.     Right Sinus: No maxillary sinus tenderness or frontal sinus tenderness.     Left Sinus: No maxillary sinus tenderness or frontal sinus tenderness.     Mouth/Throat:      Pharynx: Uvula midline.  Eyes:     General: Lids are normal. Lids are everted, no foreign bodies appreciated.     Conjunctiva/sclera: Conjunctivae normal.     Pupils: Pupils are equal, round, and reactive to light.  Neck:     Thyroid: No thyroid mass or thyromegaly.     Vascular: No carotid bruit.     Trachea: Trachea normal.  Cardiovascular:     Rate and Rhythm: Normal rate and regular rhythm.     Pulses: Normal pulses.     Heart sounds: Normal heart sounds, S1 normal and S2 normal. No murmur heard.    No friction rub. No gallop.  Pulmonary:     Effort: Pulmonary effort is normal. No tachypnea or respiratory distress.     Breath sounds: Normal breath sounds. No decreased breath sounds, wheezing, rhonchi or rales.  Abdominal:     General: Bowel sounds are normal.     Palpations: Abdomen is soft.     Tenderness: There is no abdominal tenderness.  Musculoskeletal:     Cervical back: Normal range of motion and neck supple.  Skin:    General: Skin is warm and dry.     Findings: No rash.  Neurological:     Mental Status: She is alert.  Psychiatric:        Mood and Affect: Mood is not anxious or depressed.        Speech: Speech normal.        Behavior: Behavior normal. Behavior is cooperative.        Thought Content: Thought content normal.        Judgment: Judgment normal.       Results for orders placed or performed in visit on 07/11/21  Microalbumin / creatinine urine ratio  Result Value Ref Range   Creatinine, Urine 96 20 - 275 mg/dL   Microalb, Ur 0.5 mg/dL   Microalb Creat Ratio 5 <30 mcg/mg creat  POCT glycosylated hemoglobin (Hb A1C)  Result Value Ref Range   Hemoglobin A1C 8.1 (A) 4.0 - 5.6 %   HbA1c POC (<> result, manual entry)     HbA1c, POC (prediabetic range)     HbA1c, POC (controlled diabetic range)    HM DIABETES FOOT EXAM  Result Value Ref Range   HM Diabetic Foot Exam done      COVID 19 screen:  No recent travel or known exposure to  COVID19 The patient denies respiratory symptoms of COVID 19 at this time. The importance of social distancing was discussed today.   Assessment and Plan Problem List Items Addressed This Visit  Diabetes mellitus type 2 with retinopathy ([place])    Chronic, still fluctuating blood sugars and minimal weight loss on Ozempic at the 0.5 mg weekly dose.  She feels that her blood sugars are better controlled on the Ozempic compared to the Victoza.  She does have some additional side effects similar to how she felt when she first started Victoza.  She will increase once her current dose of Ozempic is out to 1 mg weekly.  She will continue working on exercise, heart healthy diet and weight loss.      Relevant Medications   Semaglutide, 1 MG/DOSE, 4 MG/3ML SOPN   Other Visit Diagnoses     Controlled type 2 diabetes mellitus without complication, with long-term current use of insulin (HCC)    -  Primary   Relevant Medications   Semaglutide, 1 MG/DOSE, 4 MG/3ML SOPN      Meds ordered this encounter  Medications   Semaglutide, 1 MG/DOSE, 4 MG/3ML SOPN    Sig: Inject 1 mg as directed once a week.    Dispense:  3 mL    Refill:  11       Eliezer Lofts, MD

## 2021-08-27 NOTE — Assessment & Plan Note (Addendum)
Chronic, adequate control.  Stop Victoza. Start Ozempic 0.25 mg weekly, if tolerating.. increase to 0.5 mg weekly. Continue other medications: metformin 500 mg xr 2 tabs daily,and  semglee 47 units daily Work on exercise and healthy eating.   Due for urine microalbuminuria.

## 2021-10-01 ENCOUNTER — Encounter: Payer: Self-pay | Admitting: Family Medicine

## 2021-10-02 ENCOUNTER — Telehealth: Payer: BC Managed Care – PPO | Admitting: Nurse Practitioner

## 2021-10-02 ENCOUNTER — Encounter: Payer: Self-pay | Admitting: Nurse Practitioner

## 2021-10-02 VITALS — HR 110 | Wt 165.3 lb

## 2021-10-02 DIAGNOSIS — U071 COVID-19: Secondary | ICD-10-CM | POA: Diagnosis not present

## 2021-10-02 MED ORDER — MOLNUPIRAVIR EUA 200MG CAPSULE: 4.0000 | ORAL_CAPSULE | Freq: Two times a day (BID) | ORAL | 0 refills | Status: AC

## 2021-10-02 NOTE — Progress Notes (Signed)
Patient ID: [name], female    DOB: 1970-09-06, 51 y.o.   MRN: [id number]  Virtual visit completed through [employer], a video enabled telemedicine application. Due to national recommendations of social distancing due to COVID-19, a virtual visit is felt to be most appropriate for this patient at this time. Reviewed limitations, risks, security and privacy concerns of performing a virtual visit and the availability of in person appointments. I also reviewed that there may be a patient responsible charge related to this service. The patient agreed to proceed.   Patient location: home Provider location: [facility] Line at [facility], office Persons participating in this virtual visit: patient, provider   If any vitals were documented, they were collected by patient at home unless specified below.    Pulse (!) 110 Comment: per patient  Wt 165 lb 5 oz (75 kg)   BMI 27.09 kg/m    KC:LEXNT 19 Subjective:   HPI: Julie DIVIRGILIO is a 51 y.o. female presenting on 10/02/2021 for Covid Positive (On 10/01/21 and sx started on 09/30/21, body aches, chills, headache, dry cough, fever.)  Symptoms on 09/30/2021 Covid test on 10/01/2021 that was positive No sick contacts for sure States that she did go on an 8 day curise on 09/28/2021 Covid vaccine x2 and one booster  Has been taking ibuprofen helped some      Relevant past medical, surgical, family and social history reviewed and updated as indicated. Interim medical history since our last visit reviewed. Allergies and medications reviewed and updated. Outpatient Medications Prior to Visit  Medication Sig Dispense Refill   atorvastatin (LIPITOR) 20 MG tablet TAKE 1 TABLET(20 MG) BY MOUTH DAILY 90 tablet 1   BD AUTOSHIELD DUO 30G X 5 MM MISC USE TO INJECT INSULIN TWICE DAILY 200 each 3   Continuous Blood Gluc Sensor (DEXCOM G6 SENSOR) MISC Check blood sugar continuously. Dx  [id number].11.319 3 each 11   Continuous Blood Gluc Transmit (DEXCOM G6  TRANSMITTER) MISC Check sugar continuously 3 each 11   insulin glargine-yfgn (SEMGLEE, YFGN,) 100 UNIT/ML Pen Inject 47 Units into the skin daily. 15 mL 11   Insulin Pen Needle (BD PEN NEEDLE NANO U/F) 32G X 4 MM MISC USE TO INJECT INSULIN TWICE DAILY 100 each 11   metFORMIN (GLUCOPHAGE-XR) 500 MG 24 hr tablet TAKE 2 TABLETS(1000 MG) BY MOUTH DAILY WITH BREAKFAST 180 tablet 1   Semaglutide, 1 MG/DOSE, 4 MG/3ML SOPN Inject 1 mg as directed once a week. 3 mL 11   No facility-administered medications prior to visit.     Per HPI unless specifically indicated in ROS section below Review of Systems  Constitutional:  Positive for chills, fatigue and fever.  HENT:  Positive for congestion, postnasal drip, sinus pressure, sinus pain and sneezing. Negative for ear discharge, ear pain and sore throat.   Respiratory:  Positive for cough. Negative for shortness of breath.   Cardiovascular:  Negative for chest pain.  Gastrointestinal:  Negative for abdominal pain, constipation, diarrhea and nausea.  Musculoskeletal:  Positive for arthralgias.  Neurological:  Positive for headaches.   Objective:  Pulse (!) 110 Comment: per patient  Wt 165 lb 5 oz (75 kg)   BMI 27.09 kg/m   Wt Readings from Last 3 Encounters:  10/02/21 165 lb 5 oz (75 kg)  08/08/21 175 lb (79.4 kg)  07/11/21 177 lb 6 oz (80.5 kg)       Physical exam: Gen: alert, NAD, not ill appearing Pulm: speaks in complete sentences  without increased work of breathing Psych: normal mood, normal thought content      Results for orders placed or performed in visit on 07/11/21  Microalbumin / creatinine urine ratio  Result Value Ref Range   Creatinine, Urine 96 20 - 275 mg/dL   Microalb, Ur 0.5 mg/dL   Microalb Creat Ratio 5 <30 mcg/mg creat  POCT glycosylated hemoglobin (Hb A1C)  Result Value Ref Range   Hemoglobin A1C 8.1 (A) 4.0 - 5.6 %   HbA1c POC (<> result, manual entry)     HbA1c, POC (prediabetic range)     HbA1c, POC  (controlled diabetic range)    HM DIABETES FOOT EXAM  Result Value Ref Range   HM Diabetic Foot Exam done    Assessment & Plan:   Problem List Items Addressed This Visit       Other   COVID-19 - Primary    Patient COVID-19.  Discussed antiviral treatment or EUA only.  After discussion did elect that she treatment.  Also discussed CDC guidelines in regards to isolation self quarantine.  Patient did start molnupiravir.  Discussed signs and symptoms regarding to seek urgent and or emergent health care.  Follow-up if no improvement patient requested a work excuse it was sent digitally through MyChart      Relevant Medications   molnupiravir EUA (LAGEVRIO) 200 mg CAPS capsule     Meds ordered this encounter  Medications   molnupiravir EUA (LAGEVRIO) 200 mg CAPS capsule    Sig: Take 4 capsules (800 mg total) by mouth 2 (two) times daily for 5 days.    Dispense:  40 capsule    Refill:  0    Order Specific Question:   Supervising Provider    Answer:   TOWER, MARNE A [[id number]]   No orders of the defined types were placed in this encounter.   I discussed the assessment and treatment plan with the patient. The patient was provided an opportunity to ask questions and all were answered. The patient agreed with the plan and demonstrated an understanding of the instructions. The patient was advised to call back or seek an in-person evaluation if the symptoms worsen or if the condition fails to improve as anticipated.  Follow up plan: Return if symptoms worsen or fail to improve.  [name], NP

## 2021-10-02 NOTE — Telephone Encounter (Signed)
Noted will evaluate in office 

## 2021-10-02 NOTE — Telephone Encounter (Signed)
I spoke with pt; she was just returning home from 10 day cruise on 09/28/21.pt symptoms started on 09/30/21 with scratchy throat, fever 100 - 101, chills body aches; now pt still has body aches and dry cough. Pt does not know if has fever today or not. Pt had severe H/A on 10/01/21 but today just slight H/A now. Pt tested + covid on 10/01/21 with home test.pt denies any CP,SOB or wheezing. Pt scheduled VV with [name] NP on 10/02/21 at 3:20. Pt will get vitals ready when CMA calls. Self quarantine, drink plenty of fluids, rest, and take Tylenol for fever. UC & ED precautions given and pt voiced understanding. Sending note to [name] NP and Anastasiya CMA.

## 2021-10-02 NOTE — Assessment & Plan Note (Signed)
Patient COVID-19.  Discussed antiviral treatment or EUA only.  After discussion did elect that she treatment.  Also discussed CDC guidelines in regards to isolation self quarantine.  Patient did start molnupiravir.  Discussed signs and symptoms regarding to seek urgent and or emergent health care.  Follow-up if no improvement patient requested a work excuse it was sent digitally through [employer]

## 2021-11-15 ENCOUNTER — Other Ambulatory Visit: Payer: Self-pay | Admitting: Family Medicine

## 2021-12-15 ENCOUNTER — Telehealth: Payer: Self-pay | Admitting: Family Medicine

## 2021-12-15 DIAGNOSIS — E119 Type 2 diabetes mellitus without complications: Secondary | ICD-10-CM

## 2021-12-15 NOTE — Telephone Encounter (Signed)
LVM to call back.

## 2021-12-15 NOTE — Telephone Encounter (Signed)
Please call and schedule CPE with fasting labs prior for Dr. Diona Browner after 01/07/21.

## 2022-01-08 ENCOUNTER — Other Ambulatory Visit: Payer: BC Managed Care – PPO

## 2022-01-08 NOTE — Telephone Encounter (Signed)
-----   Message from Terri J Walsh sent at 12/29/2021 11:57 AM EDT ----- Regarding: Lab orders for Thursday, 11.9.23 Patient is scheduled for CPX labs, please order future labs, Thanks , Terri   

## 2022-01-15 ENCOUNTER — Encounter: Payer: BC Managed Care – PPO | Admitting: Family Medicine

## 2022-01-26 ENCOUNTER — Other Ambulatory Visit (INDEPENDENT_AMBULATORY_CARE_PROVIDER_SITE_OTHER): Payer: BC Managed Care – PPO

## 2022-01-26 DIAGNOSIS — E119 Type 2 diabetes mellitus without complications: Secondary | ICD-10-CM | POA: Diagnosis not present

## 2022-01-26 DIAGNOSIS — Z794 Long term (current) use of insulin: Secondary | ICD-10-CM

## 2022-01-26 LAB — COMPREHENSIVE METABOLIC PANEL
ALT: 16 U/L (ref 0–35)
AST: 12 U/L (ref 0–37)
Albumin: 4.4 g/dL (ref 3.5–5.2)
Alkaline Phosphatase: 53 U/L (ref 39–117)
BUN: 16 mg/dL (ref 6–23)
CO2: 24 mEq/L (ref 19–32)
Calcium: 9.6 mg/dL (ref 8.4–10.5)
Chloride: 99 mEq/L (ref 96–112)
Creatinine, Ser: 0.75 mg/dL (ref 0.40–1.20)
GFR: 93.65 mL/min (ref >60.00)
Glucose, Bld: 166 mg/dL — ABNORMAL HIGH (ref 70–99)
Potassium: 3.9 mEq/L (ref 3.5–5.1)
Sodium: 136 mEq/L (ref 135–145)
Total Bilirubin: 0.6 mg/dL (ref 0.2–1.2)
Total Protein: 7.2 g/dL (ref 6.0–8.3)

## 2022-01-26 LAB — MICROALBUMIN / CREATININE URINE RATIO
Creatinine,U: 290 mg/dL
Microalb Creat Ratio: 0.8 mg/g (ref 0.0–30.0)
Microalb, Ur: 2.2 mg/dL — ABNORMAL HIGH (ref 0.0–1.9)

## 2022-01-26 LAB — LIPID PANEL
Cholesterol: 181 mg/dL (ref 0–200)
HDL: 74.7 mg/dL (ref >39.00)
LDL Cholesterol: 84 mg/dL (ref 0–99)
NonHDL: 106.28
Total CHOL/HDL Ratio: 2
Triglycerides: 113 mg/dL (ref 0.0–149.0)
VLDL: 22.6 mg/dL (ref 0.0–40.0)

## 2022-01-26 LAB — HEMOGLOBIN A1C: Hgb A1c MFr Bld: 7.2 % — ABNORMAL HIGH (ref 4.6–6.5)

## 2022-01-27 NOTE — Progress Notes (Signed)
No critical labs need to be addressed urgently. We will discuss labs in detail at upcoming office visit.   

## 2022-01-29 ENCOUNTER — Ambulatory Visit (INDEPENDENT_AMBULATORY_CARE_PROVIDER_SITE_OTHER): Payer: BC Managed Care – PPO | Admitting: Family Medicine

## 2022-01-29 ENCOUNTER — Encounter: Payer: Self-pay | Admitting: Family Medicine

## 2022-01-29 VITALS — BP 126/82 | HR 97 | Temp 97.1°F | Ht 65.25 in | Wt 159.8 lb

## 2022-01-29 DIAGNOSIS — E1169 Type 2 diabetes mellitus with other specified complication: Secondary | ICD-10-CM | POA: Diagnosis not present

## 2022-01-29 DIAGNOSIS — Z794 Long term (current) use of insulin: Secondary | ICD-10-CM

## 2022-01-29 DIAGNOSIS — Z23 Encounter for immunization: Secondary | ICD-10-CM

## 2022-01-29 DIAGNOSIS — Z0000 Encounter for general adult medical examination without abnormal findings: Secondary | ICD-10-CM | POA: Diagnosis not present

## 2022-01-29 DIAGNOSIS — Z1211 Encounter for screening for malignant neoplasm of colon: Secondary | ICD-10-CM

## 2022-01-29 DIAGNOSIS — E11319 Type 2 diabetes mellitus with unspecified diabetic retinopathy without macular edema: Secondary | ICD-10-CM

## 2022-01-29 DIAGNOSIS — E785 Hyperlipidemia, unspecified: Secondary | ICD-10-CM

## 2022-01-29 MED ORDER — METFORMIN HCL ER 500 MG PO TB24: ORAL_TABLET | ORAL | 3 refills | Status: DC

## 2022-01-29 MED ORDER — ATORVASTATIN CALCIUM 20 MG PO TABS: ORAL_TABLET | ORAL | 3 refills | Status: DC

## 2022-01-29 MED ORDER — INSULIN GLARGINE-YFGN 100 UNIT/ML ~~LOC~~ SOPN: 47.0000 [IU] | PEN_INJECTOR | Freq: Every day | SUBCUTANEOUS | 11 refills | Status: DC

## 2022-01-29 NOTE — Assessment & Plan Note (Signed)
Chronic improved control  Ozempic 0.5 mg weekly, metformin 500 mg XR 2 tabs daily and Semglee 47 units daily

## 2022-01-29 NOTE — Assessment & Plan Note (Signed)
Stable, chronic.  Continue current medication. ? ? ?Atorvastatin 20 mg daily ?

## 2022-01-29 NOTE — Progress Notes (Signed)
Patient ID: [name], female    DOB: April 15, 1970, 51 y.o.   MRN: [id number]  This visit was conducted in person.  BP 126/82   Pulse 97   Temp (!) 97.1 F (36.2 C) (Temporal)   Ht 5' 5.25" (1.657 m)   Wt 159 lb 12.8 oz (72.5 kg)   LMP 01/04/2022   SpO2 99%   BMI 26.39 kg/m    CC:  Chief Complaint  Patient presents with   Annual Exam    Subjective:   HPI: [name] is a 51 y.o. female presenting on 01/29/2022 for Annual Exam  Diabetes: Improved diabetes control on current regimen She is now on Ozempic 1 mg weekly, metformin 500 mg XR 2 tabs daily and Semglee 47 units daily She has lost 20 pounds in the last 5 months Lab Results  Component Value Date   HGBA1C 7.2 (H) 01/26/2022  Using medications without difficulties: Hypoglycemic episodes: rare Hyperglycemic episodes:rare Feet problems: none Blood Sugars averaging:  using continuous glucose monitor, FBS 80-150.. higher with menses. eye exam within last year:  none Associated with diabetic retinopathy Urine microalbumin test within normal range Wt Readings from Last 3 Encounters:  01/29/22 159 lb 12.8 oz (72.5 kg)  10/02/21 165 lb 5 oz (75 kg)  08/08/21 175 lb (79.4 kg)     Elevated Cholesterol: LDL at goal less than 100 on atorvastatin 20 mg daily Lab Results  Component Value Date   CHOL 181 01/26/2022   HDL 74.70 01/26/2022   LDLCALC 84 01/26/2022   LDLDIRECT 127.0 09/10/2011   TRIG 113.0 01/26/2022   CHOLHDL 2 01/26/2022  Using medications without problems: Muscle aches:  Diet compliance: low carb Exercise: 2-3 days a week 1 hour a time Other complaints:       Relevant past medical, surgical, family and social history reviewed and updated as indicated. Interim medical history since our last visit reviewed. Allergies and medications reviewed and updated. Outpatient Medications Prior to Visit  Medication Sig Dispense Refill   atorvastatin (LIPITOR) 20 MG tablet TAKE 1 TABLET(20 MG) BY  MOUTH DAILY 90 tablet 0   BD AUTOSHIELD DUO 30G X 5 MM MISC USE TO INJECT INSULIN TWICE DAILY 200 each 3   Continuous Blood Gluc Sensor (DEXCOM G6 SENSOR) MISC CHECK BLOOD SUGAR CONTINUOUSLY 3 each 11   Continuous Blood Gluc Transmit (DEXCOM G6 TRANSMITTER) MISC Check sugar continuously 3 each 11   insulin glargine-yfgn (SEMGLEE, YFGN,) 100 UNIT/ML Pen Inject 47 Units into the skin daily. 15 mL 11   Insulin Pen Needle (BD PEN NEEDLE NANO U/F) 32G X 4 MM MISC USE TO INJECT INSULIN TWICE DAILY 100 each 11   metFORMIN (GLUCOPHAGE-XR) 500 MG 24 hr tablet TAKE 2 TABLETS(1000 MG) BY MOUTH DAILY WITH BREAKFAST 180 tablet 0   Semaglutide, 1 MG/DOSE, 4 MG/3ML SOPN Inject 1 mg as directed once a week. 3 mL 11   No facility-administered medications prior to visit.     Per HPI unless specifically indicated in ROS section below Review of Systems  Constitutional:  Negative for fatigue and fever.  HENT:  Negative for congestion.   Eyes:  Negative for pain.  Respiratory:  Negative for cough and shortness of breath.   Cardiovascular:  Negative for chest pain, palpitations and leg swelling.  Gastrointestinal:  Negative for abdominal pain.  Genitourinary:  Negative for dysuria and vaginal bleeding.  Musculoskeletal:  Negative for back pain.  Neurological:  Negative for syncope, light-headedness and headaches.  Psychiatric/Behavioral:  Negative for dysphoric mood.    Objective:  BP 126/82   Pulse 97   Temp (!) 97.1 F (36.2 C) (Temporal)   Ht 5' 5.25" (1.657 m)   Wt 159 lb 12.8 oz (72.5 kg)   LMP 01/04/2022   SpO2 99%   BMI 26.39 kg/m   Wt Readings from Last 3 Encounters:  01/29/22 159 lb 12.8 oz (72.5 kg)  10/02/21 165 lb 5 oz (75 kg)  08/08/21 175 lb (79.4 kg)      Physical Exam Vitals and nursing note reviewed.  Constitutional:      General: She is not in acute distress.    Appearance: Normal appearance. She is well-developed. She is not ill-appearing or toxic-appearing.  HENT:      Head: Normocephalic.     Right Ear: Hearing, tympanic membrane, ear canal and external ear normal.     Left Ear: Hearing, tympanic membrane, ear canal and external ear normal.     Nose: Nose normal.  Eyes:     General: Lids are normal. Lids are everted, no foreign bodies appreciated.     Conjunctiva/sclera: Conjunctivae normal.     Pupils: Pupils are equal, round, and reactive to light.  Neck:     Thyroid: No thyroid mass or thyromegaly.     Vascular: No carotid bruit.     Trachea: Trachea normal.  Cardiovascular:     Rate and Rhythm: Normal rate and regular rhythm.     Heart sounds: Normal heart sounds, S1 normal and S2 normal. No murmur heard.    No gallop.  Pulmonary:     Effort: Pulmonary effort is normal. No respiratory distress.     Breath sounds: Normal breath sounds. No wheezing, rhonchi or rales.  Abdominal:     General: Bowel sounds are normal. There is no distension or abdominal bruit.     Palpations: Abdomen is soft. There is no fluid wave or mass.     Tenderness: There is no abdominal tenderness. There is no guarding or rebound.     Hernia: No hernia is present.  Musculoskeletal:     Cervical back: Normal range of motion and neck supple.  Lymphadenopathy:     Cervical: No cervical adenopathy.  Skin:    General: Skin is warm and dry.     Findings: No rash.  Neurological:     Mental Status: She is alert.     Cranial Nerves: No cranial nerve deficit.     Sensory: No sensory deficit.  Psychiatric:        Mood and Affect: Mood is not anxious or depressed.        Speech: Speech normal.        Behavior: Behavior normal. Behavior is cooperative.        Judgment: Judgment normal.       Results for orders placed or performed in visit on 01/26/22  Microalbumin / creatinine urine ratio  Result Value Ref Range   Microalb, Ur 2.2 (H) 0.0 - 1.9 mg/dL   Creatinine,U 250.5 mg/dL   Microalb Creat Ratio 0.8 0.0 - 30.0 mg/g  Comprehensive metabolic panel  Result Value Ref  Range   Sodium 136 135 - 145 mEq/L   Potassium 3.9 3.5 - 5.1 mEq/L   Chloride 99 96 - 112 mEq/L   CO2 24 19 - 32 mEq/L   Glucose, Bld 166 (H) 70 - 99 mg/dL   BUN 16 6 - 23 mg/dL   Creatinine, Ser 3.97 0.40 -  1.20 mg/dL   Total Bilirubin 0.6 0.2 - 1.2 mg/dL   Alkaline Phosphatase 53 39 - 117 U/L   AST 12 0 - 37 U/L   ALT 16 0 - 35 U/L   Total Protein 7.2 6.0 - 8.3 g/dL   Albumin 4.4 3.5 - 5.2 g/dL   GFR 09.32 >67.12 mL/min   Calcium 9.6 8.4 - 10.5 mg/dL  Lipid panel  Result Value Ref Range   Cholesterol 181 0 - 200 mg/dL   Triglycerides 458.0 0.0 - 149.0 mg/dL   HDL 99.83 >38.25 mg/dL   VLDL 05.3 0.0 - 97.6 mg/dL   LDL Cholesterol 84 0 - 99 mg/dL   Total CHOL/HDL Ratio 2    NonHDL 106.28   Hemoglobin A1c  Result Value Ref Range   Hgb A1c MFr Bld 7.2 (H) 4.6 - 6.5 %     COVID 19 screen:  No recent travel or known exposure to COVID19 The patient denies respiratory symptoms of COVID 19 at this time. The importance of social distancing was discussed today.   Assessment and Plan   The patient's preventative maintenance and recommended screening tests for an annual wellness exam were reviewed in full today. Brought up to date unless services declined.  Counselled on the importance of diet, exercise, and its role in overall health and mortality. The patient's FH and SH was reviewed, including their home life, tobacco status, and drug and alcohol status.   DVE/PAP: nml pap 2021, neg HPV on q5 schedule  Vaccines: Td, uptodate. PNA vaccine uptodate. Flu shot given today No family ovarian and uterine cancer.  Mammo: 5/20233 Remote former smoker  Refused STD screen.  No early colon cancer in family, due for colon cancer screening. Will start with ifob... has not returned yet... will change plan to colonoscopy.. referral placed      Hep C: done  Problem List Items Addressed This Visit     Diabetes mellitus type 2 with retinopathy (HCC)    Chronic improved control  Ozempic  0.5 mg weekly, metformin 500 mg XR 2 tabs daily and Semglee 47 units daily      Relevant Medications   metFORMIN (GLUCOPHAGE-XR) 500 MG 24 hr tablet   atorvastatin (LIPITOR) 20 MG tablet   insulin glargine-yfgn (SEMGLEE, YFGN,) 100 UNIT/ML Pen   Diabetic retinopathy associated with controlled type 2 diabetes mellitus (HCC)    Chronic, stable followed by ophthalmology      Relevant Medications   metFORMIN (GLUCOPHAGE-XR) 500 MG 24 hr tablet   atorvastatin (LIPITOR) 20 MG tablet   insulin glargine-yfgn (SEMGLEE, YFGN,) 100 UNIT/ML Pen   Hyperlipidemia associated with type 2 diabetes mellitus (HCC)    Stable, chronic.  Continue current medication.  Atorvastatin 20 mg daily      Relevant Medications   metFORMIN (GLUCOPHAGE-XR) 500 MG 24 hr tablet   atorvastatin (LIPITOR) 20 MG tablet   insulin glargine-yfgn (SEMGLEE, YFGN,) 100 UNIT/ML Pen   Other Visit Diagnoses     Routine general medical examination at a health care facility    -  Primary   Need for influenza vaccination       Relevant Orders   Flu Vaccine QUAD 38mo+IM (Fluarix, Fluzone & Alfiuria Quad PF) (Completed)   Colon cancer screening       Relevant Orders   Ambulatory referral to Gastroenterology      Meds ordered this encounter  Medications   metFORMIN (GLUCOPHAGE-XR) 500 MG 24 hr tablet    Sig: TAKE 2 TABLETS(1000 MG)  BY MOUTH DAILY WITH BREAKFAST    Dispense:  180 tablet    Refill:  3   atorvastatin (LIPITOR) 20 MG tablet    Sig: TAKE 1 TABLET(20 MG) BY MOUTH DAILY    Dispense:  90 tablet    Refill:  3   insulin glargine-yfgn (SEMGLEE, YFGN,) 100 UNIT/ML Pen    Sig: Inject 47 Units into the skin daily.    Dispense:  15 mL    Refill:  11    Kerby NoraAmy Naje Rice, MD

## 2022-01-29 NOTE — Assessment & Plan Note (Signed)
Chronic, stable followed by ophthalmology

## 2022-02-16 ENCOUNTER — Encounter: Payer: Self-pay | Admitting: Family Medicine

## 2022-02-16 MED ORDER — BD PEN NEEDLE NANO U/F 32G X 4 MM MISC: 3 refills | Status: AC

## 2022-02-16 NOTE — Telephone Encounter (Signed)
Pen needle refill sent to [facility] in [place].

## 2022-02-17 ENCOUNTER — Other Ambulatory Visit: Payer: Self-pay | Admitting: Family Medicine

## 2022-02-17 MED ORDER — LANTUS SOLOSTAR 100 UNIT/ML ~~LOC~~ SOPN: 43.0000 [IU] | PEN_INJECTOR | Freq: Every day | SUBCUTANEOUS | 99 refills | Status: DC

## 2022-02-17 MED ORDER — SEMAGLUTIDE (2 MG/DOSE) 8 MG/3ML ~~LOC~~ SOPN: 2.0000 mg | PEN_INJECTOR | SUBCUTANEOUS | 11 refills | Status: DC

## 2022-03-09 ENCOUNTER — Encounter: Payer: Self-pay | Admitting: Family Medicine

## 2022-03-17 ENCOUNTER — Other Ambulatory Visit: Payer: Self-pay | Admitting: Family Medicine

## 2022-03-21 ENCOUNTER — Other Ambulatory Visit: Payer: Self-pay | Admitting: Family Medicine

## 2022-03-23 LAB — HM DIABETES EYE EXAM

## 2022-06-17 LAB — HM MAMMOGRAPHY

## 2022-08-27 ENCOUNTER — Ambulatory Visit: Payer: No Typology Code available for payment source | Admitting: Family Medicine

## 2022-08-27 ENCOUNTER — Encounter: Payer: Self-pay | Admitting: Family Medicine

## 2022-08-27 VITALS — BP 126/80 | HR 111 | Temp 97.5°F | Ht 65.25 in | Wt 139.2 lb

## 2022-08-27 DIAGNOSIS — Z794 Long term (current) use of insulin: Secondary | ICD-10-CM | POA: Diagnosis not present

## 2022-08-27 DIAGNOSIS — R000 Tachycardia, unspecified: Secondary | ICD-10-CM | POA: Diagnosis not present

## 2022-08-27 DIAGNOSIS — R55 Syncope and collapse: Secondary | ICD-10-CM | POA: Insufficient documentation

## 2022-08-27 DIAGNOSIS — E11319 Type 2 diabetes mellitus with unspecified diabetic retinopathy without macular edema: Secondary | ICD-10-CM

## 2022-08-27 LAB — COMPREHENSIVE METABOLIC PANEL
ALT: 17 U/L (ref 0–35)
AST: 17 U/L (ref 0–37)
Albumin: 4.2 g/dL (ref 3.5–5.2)
Alkaline Phosphatase: 50 U/L (ref 39–117)
BUN: 9 mg/dL (ref 6–23)
CO2: 26 mEq/L (ref 19–32)
Calcium: 9.5 mg/dL (ref 8.4–10.5)
Chloride: 101 mEq/L (ref 96–112)
Creatinine, Ser: 0.59 mg/dL (ref 0.40–1.20)
GFR: 105.58 mL/min (ref >60.00)
Glucose, Bld: 156 mg/dL — ABNORMAL HIGH (ref 70–99)
Potassium: 4.1 mEq/L (ref 3.5–5.1)
Sodium: 136 mEq/L (ref 135–145)
Total Bilirubin: 0.8 mg/dL (ref 0.2–1.2)
Total Protein: 7.1 g/dL (ref 6.0–8.3)

## 2022-08-27 LAB — CBC WITH DIFFERENTIAL/PLATELET
Basophils Absolute: 0.1 10*3/uL (ref 0.0–0.1)
Basophils Relative: 0.5 % (ref 0.0–3.0)
Eosinophils Absolute: 0.3 10*3/uL (ref 0.0–0.7)
Eosinophils Relative: 3.1 % (ref 0.0–5.0)
HCT: 40.5 % (ref 36.0–46.0)
Hemoglobin: 13.6 g/dL (ref 12.0–15.0)
Lymphocytes Relative: 20.3 % (ref 12.0–46.0)
Lymphs Abs: 2 10*3/uL (ref 0.7–4.0)
MCHC: 33.5 g/dL (ref 30.0–36.0)
MCV: 95.1 fl (ref 78.0–100.0)
Monocytes Absolute: 0.7 10*3/uL (ref 0.1–1.0)
Monocytes Relative: 7.5 % (ref 3.0–12.0)
Neutro Abs: 6.6 10*3/uL (ref 1.4–7.7)
Neutrophils Relative %: 68.6 % (ref 43.0–77.0)
Platelets: 312 10*3/uL (ref 150.0–400.0)
RBC: 4.26 Mil/uL (ref 3.87–5.11)
RDW: 13.5 % (ref 11.5–15.5)
WBC: 9.6 10*3/uL (ref 4.0–10.5)

## 2022-08-27 LAB — POCT GLYCOSYLATED HEMOGLOBIN (HGB A1C): Hemoglobin A1C: 6.6 % — AB (ref 4.0–5.6)

## 2022-08-27 LAB — TSH: TSH: 1.5 u[IU]/mL (ref 0.35–5.50)

## 2022-08-27 LAB — HM DIABETES FOOT EXAM

## 2022-08-27 LAB — VITAMIN B12: Vitamin B-12: 406 pg/mL (ref 211–911)

## 2022-08-27 NOTE — Assessment & Plan Note (Signed)
Chronic, stable followed by ophthalmology 

## 2022-08-27 NOTE — Progress Notes (Signed)
Patient ID: [name], female    DOB: Aug 16, 1970, 52 y.o.   MRN: [id number]  This visit was conducted in person.  BP 126/80   Pulse (!) 111   Temp (!) 97.5 F (36.4 C) (Temporal)   Ht 5' 5.25" (1.657 m)   Wt 139 lb 4 oz (63.2 kg)   LMP 08/10/2022   SpO2 98%   BMI 23.00 kg/m    CC:  Chief Complaint  Patient presents with   Medical Management of Chronic Issues    Here for DM follow up. Also, wants to discuss recent syncope episode.     Subjective:   HPI: [name] is a 52 y.o. female presenting on 08/27/2022 for Medical Management of Chronic Issues (Here for DM follow up. Also, wants to discuss recent syncope episode. )  Diabetes: Improved diabetes control on current regimen She is now on Ozempic 1 mg weekly, metformin 500 mg XR 2 tabs daily and Semglee 47 units daily She has  continued to lose weight. Lab Results  Component Value Date   HGBA1C 6.6 (A) 08/27/2022  Using medications without difficulties: Hypoglycemic episodes: rare Hyperglycemic episodes:rare Feet problems: none Blood Sugars averaging:  using continuous glucose monitor, FBS 75-150.. higher with menses. eye exam within last year:  none Associated with diabetic retinopathy Urine microalbumin test within normal range Wt Readings from Last 3 Encounters:  08/27/22 139 lb 4 oz (63.2 kg)  01/29/22 159 lb 12.8 oz (72.5 kg)  10/02/21 165 lb 5 oz (75 kg)  Body mass index is 23 kg/m.  BP Readings from Last 3 Encounters:  08/27/22 126/80  01/29/22 126/82  08/08/21 120/72   HR elevated today. Baseline is 88-97   She reports recent syncopal spell  Had felt normally prior. Had not had much water that day or eaten much earlier that day.  She had eaten, had 1 mixed drink. Was sitting  Suddenly felt dizzy, tingly, weird feeling, heart racing.  No SOB, no chest pain.  As she was walking out of restaurant.. she crumpled to floor, he caught her and  she came to.  She had LOC 1-2 min per husband.   EMS came, EKG showed tachycardia HR 123, BP 132/80  CBG: 165  She was feeling better so chose not to go to hospital.   She has been under more stress lately.   No further issues since.. she has been trying to eat better and increase water.   No new medications.        Relevant past medical, surgical, family and social history reviewed and updated as indicated. Interim medical history since our last visit reviewed. Allergies and medications reviewed and updated. Outpatient Medications Prior to Visit  Medication Sig Dispense Refill   atorvastatin (LIPITOR) 20 MG tablet TAKE 1 TABLET(20 MG) BY MOUTH DAILY 90 tablet 3   Continuous Blood Gluc Sensor (DEXCOM G6 SENSOR) MISC CHECK BLOOD SUGAR CONTINUOUSLY 3 each 11   Continuous Blood Gluc Transmit (DEXCOM G6 TRANSMITTER) MISC CHECK SUGAR CONTINUOUSLY 1 each 3   insulin glargine (LANTUS SOLOSTAR) 100 UNIT/ML Solostar Pen Inject 43 Units into the skin daily. Please wait until March 02, 2022 to fill 15 mL PRN   Insulin Pen Needle (BD PEN NEEDLE NANO U/F) 32G X 4 MM MISC USE TO INJECT INSULIN DAILY 100 each 3   metFORMIN (GLUCOPHAGE-XR) 500 MG 24 hr tablet TAKE 2 TABLETS(1000 MG) BY MOUTH DAILY WITH BREAKFAST 180 tablet 3   Semaglutide, 2 MG/DOSE, 8  MG/3ML SOPN Inject 2 mg as directed once a week. 3 mL 11   No facility-administered medications prior to visit.     Per HPI unless specifically indicated in ROS section below Review of Systems  Constitutional:  Negative for fatigue and fever.  HENT:  Negative for congestion.   Eyes:  Negative for pain.  Respiratory:  Negative for cough and shortness of breath.   Cardiovascular:  Negative for chest pain, palpitations and leg swelling.  Gastrointestinal:  Negative for abdominal pain.  Genitourinary:  Negative for dysuria and vaginal bleeding.  Musculoskeletal:  Negative for back pain.  Neurological:  Negative for syncope, light-headedness and headaches.  Psychiatric/Behavioral:  Negative for  dysphoric mood.    Objective:  BP 126/80   Pulse (!) 111   Temp (!) 97.5 F (36.4 C) (Temporal)   Ht 5' 5.25" (1.657 m)   Wt 139 lb 4 oz (63.2 kg)   LMP 08/10/2022   SpO2 98%   BMI 23.00 kg/m   Wt Readings from Last 3 Encounters:  08/27/22 139 lb 4 oz (63.2 kg)  01/29/22 159 lb 12.8 oz (72.5 kg)  10/02/21 165 lb 5 oz (75 kg)      Physical Exam Vitals and nursing note reviewed.  Constitutional:      General: She is not in acute distress.    Appearance: Normal appearance. She is well-developed. She is not ill-appearing or toxic-appearing.  HENT:     Head: Normocephalic.     Right Ear: Hearing, tympanic membrane, ear canal and external ear normal.     Left Ear: Hearing, tympanic membrane, ear canal and external ear normal.     Nose: Nose normal.  Eyes:     General: Lids are normal. Lids are everted, no foreign bodies appreciated.     Conjunctiva/sclera: Conjunctivae normal.     Pupils: Pupils are equal, round, and reactive to light.  Neck:     Thyroid: No thyroid mass or thyromegaly.     Vascular: No carotid bruit.     Trachea: Trachea normal.  Cardiovascular:     Rate and Rhythm: Normal rate and regular rhythm.     Heart sounds: Normal heart sounds, S1 normal and S2 normal. No murmur heard.    No gallop.  Pulmonary:     Effort: Pulmonary effort is normal. No respiratory distress.     Breath sounds: Normal breath sounds. No wheezing, rhonchi or rales.  Abdominal:     General: Bowel sounds are normal. There is no distension or abdominal bruit.     Palpations: Abdomen is soft. There is no fluid wave or mass.     Tenderness: There is no abdominal tenderness. There is no guarding or rebound.     Hernia: No hernia is present.  Musculoskeletal:     Cervical back: Normal range of motion and neck supple.  Lymphadenopathy:     Cervical: No cervical adenopathy.  Skin:    General: Skin is warm and dry.     Findings: No rash.  Neurological:     Mental Status: She is alert.      Cranial Nerves: No cranial nerve deficit.     Sensory: No sensory deficit.  Psychiatric:        Mood and Affect: Mood is not anxious or depressed.        Speech: Speech normal.        Behavior: Behavior normal. Behavior is cooperative.        Judgment: Judgment normal.  Diabetic foot exam: Normal inspection No skin breakdown No calluses  Normal DP pulses Normal sensation to light touch and monofilament Nails normal     Results for orders placed or performed in visit on 08/27/22  POCT glycosylated hemoglobin (Hb A1C)  Result Value Ref Range   Hemoglobin A1C 6.6 (A) 4.0 - 5.6 %   HbA1c POC (<> result, manual entry)     HbA1c, POC (prediabetic range)     HbA1c, POC (controlled diabetic range)    HM DIABETES FOOT EXAM  Result Value Ref Range   HM Diabetic Foot Exam done      COVID 19 screen:  No recent travel or known exposure to COVID19 The patient denies respiratory symptoms of COVID 19 at this time. The importance of social distancing was discussed today.   Assessment and Plan   The patient's preventative maintenance and recommended screening tests for an annual wellness exam were reviewed in full today. Brought up to date unless services declined.  Counselled on the importance of diet, exercise, and its role in overall health and mortality. The patient's FH and SH was reviewed, including their home life, tobacco status, and drug and alcohol status.    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 No clear seizure activity.  Normal neuroexam today. No orthostatic hypotension. EKG unchanged from previous on unremarkable other than sinus tachycardia. Will evaluate with labs for additional secondary causes.  Will refer to cardiology for further evaluation given increased risk for heart disease and unexplained nature of syncopal event.      Relevant Orders   EKG 12-Lead (Completed)   CBC with Differential/Platelet   Comprehensive metabolic panel   TSH   Vitamin B12   Ambulatory referral to Cardiology   No orders of the defined types were placed in this encounter.  EKG: unchanged from previous tracings compared to 2018 normal coordinator will get you scheduled for already did you see [place] access Thank you Chairs there are no call yet for the lab okay, sinus tachycardia.  Kerby Nora, MD

## 2022-08-27 NOTE — Assessment & Plan Note (Signed)
Acute, occurred after eating No hypoglycemia Possibly secondary to vasovagal syndrome versus dehydration. No clear seizure activity.  Normal neuroexam today. No orthostatic hypotension. EKG unchanged from previous on unremarkable other than sinus tachycardia. Will evaluate with labs for additional secondary causes.  Will refer to cardiology for further evaluation given increased risk for heart disease and unexplained nature of syncopal event.

## 2022-08-27 NOTE — Assessment & Plan Note (Signed)
Chronic improved control  Ozempic 0.5 mg weekly, metformin 500 mg XR 2 tabs daily and Semglee 47 units daily 

## 2022-10-30 ENCOUNTER — Encounter: Payer: Self-pay | Admitting: Family Medicine

## 2022-10-30 ENCOUNTER — Other Ambulatory Visit: Payer: Self-pay | Admitting: Family Medicine

## 2022-10-30 MED ORDER — DEXCOM G6 SENSOR MISC: 11 refills | Status: DC

## 2022-11-06 ENCOUNTER — Ambulatory Visit: Payer: No Typology Code available for payment source

## 2022-11-06 ENCOUNTER — Ambulatory Visit: Payer: No Typology Code available for payment source | Attending: Cardiology | Admitting: Cardiology

## 2022-11-06 ENCOUNTER — Encounter: Payer: Self-pay | Admitting: Cardiology

## 2022-11-06 VITALS — BP 148/82 | HR 92 | Ht 66.0 in | Wt 137.0 lb

## 2022-11-06 DIAGNOSIS — E7800 Pure hypercholesterolemia, unspecified: Secondary | ICD-10-CM

## 2022-11-06 DIAGNOSIS — R030 Elevated blood-pressure reading, without diagnosis of hypertension: Secondary | ICD-10-CM

## 2022-11-06 DIAGNOSIS — R55 Syncope and collapse: Secondary | ICD-10-CM | POA: Diagnosis not present

## 2022-11-06 NOTE — Patient Instructions (Signed)
Medication Instructions:   Your physician recommends that you continue on your current medications as directed. Please refer to the Current Medication list given to you today.  *If you need a refill on your cardiac medications before your next appointment, please call your pharmacy*   Lab Work:  None Ordered  If you have labs (blood work) drawn today and your tests are completely normal, you will receive your results only by: MyChart Message (if you have MyChart) OR A paper copy in the mail If you have any lab test that is abnormal or we need to change your treatment, we will call you to review the results.   Testing/Procedures:  Your physician has requested that you have an echocardiogram. Echocardiography is a painless test that uses sound waves to create images of your heart. It provides your doctor with information about the size and shape of your heart and how well your heart's chambers and valves are working. This procedure takes approximately one hour. There are no restrictions for this procedure. Please do NOT wear cologne, perfume, aftershave, or lotions (deodorant is allowed). Please arrive 15 minutes prior to your appointment time.  Your physician has recommended that you wear a Zio monitor.   This monitor is a medical device that records the heart's electrical activity. Doctors most often use these monitors to diagnose arrhythmias. Arrhythmias are problems with the speed or rhythm of the heartbeat. The monitor is a small device applied to your chest. You can wear one while you do your normal daily activities. While wearing this monitor if you have any symptoms to push the button and record what you felt. Once you have worn this monitor for the period of time provider prescribed (Usually 14 days), you will return the monitor device in the postage paid box. Once it is returned they will download the data collected and provide [place] with a report which the provider will then review and  we will call you with those results. Important tips:  Avoid showering during the first 24 hours of wearing the monitor. Avoid excessive sweating to help maximize wear time. Do not submerge the device, no hot tubs, and no swimming pools. Keep any lotions or oils away from the patch. After 24 hours you may shower with the patch on. Take brief showers with your back facing the shower head.  Do not remove patch once it has been placed because that will interrupt data and decrease adhesive wear time. Push the button when you have any symptoms and write down what you were feeling. Once you have completed wearing your monitor, remove and place into box which has postage paid and place in your outgoing mailbox.  If for some reason you have misplaced your box then call our office and we can provide another box and/or mail it off for you.     Follow-Up: At [facility], you and your health needs are our priority.  As part of our continuing mission to provide you with exceptional heart care, we have created designated Provider Care Teams.  These Care Teams include your primary Cardiologist (physician) and Advanced Practice Providers (APPs -  Physician Assistants and Nurse Practitioners) who all work together to provide you with the care you need, when you need it.  We recommend signing up for the patient portal called "MyChart".  Sign up information is provided on this After Visit Summary.  MyChart is used to connect with patients for Virtual Visits (Telemedicine).  Patients are able to view  lab/test results, encounter notes, upcoming appointments, etc.  Non-urgent messages can be sent to your provider as well.   To learn more about what you can do with MyChart, go to [email or web address].    Your next appointment:    After Cardiac testing   Provider:   You may see Debbe Odea, MD or one of the following Advanced Practice Providers on your designated Care Team:   Nicolasa Ducking,  NP Eula Listen, PA-C Cadence Fransico Michael, PA-C Charlsie Quest, NP

## 2022-11-06 NOTE — Progress Notes (Signed)
Cardiology Office Note:    Date:  11/06/2022   ID:  [name], DOB 08-09-1970, MRN [id number]  PCP:  Excell Seltzer, MD   [facility] Providers Cardiologist:  Debbe Odea, MD     Referring MD: Excell Seltzer, MD   Chief Complaint  Patient presents with   New Patient (Initial Visit)    Referred for cardiac evaluation of syncope and tachycardia.  Was seen by cardiology about 10 years ago.  Patient had an episode of "not feeling right" then had a syncope episode in 08/2022.  No further episodes.     [name] is a 52 y.o. female who is being seen today for the evaluation of tachycardia at the request of Excell Seltzer, MD.   History of Present Illness:    [name] is a 52 y.o. female with a hx of hyperlipidemia, diabetes who presents due to syncope and tachycardia.  About 2 to 3 months ago, patient passed out while at a restaurant.  She states having a cocktail, eating some sushi prior to feeling a little dizzy.  She walked to the waiting area on her way out to get some air when she suddenly passed out.  Does not remember much, but her husband helped her up to a chair.  EMS was called, rhythm showed sinus tachycardia heart rate about 114 bpm.  Blood sugar was in the 140s.  She felt better a few minutes later, declined going to the ED.  Followed up with PCP who recommended cardiac eval.  She denies any prior symptoms of syncope, has not had any episodes since.  Denies chest pain, palpitations or shortness of breath.  Father had a heart attack in his 1s.  Mother died suddenly, patient not sure why.  Past Medical History:  Diagnosis Date   Anemia, unspecified    Other and unspecified hyperlipidemia    Palpitations    Type II or unspecified type diabetes mellitus without mention of complication, not stated as uncontrolled     Past Surgical History:  Procedure Laterality Date   CESAREAN SECTION     ENDOMETRIAL BIOPSY      Current Medications: Current  Meds  Medication Sig   atorvastatin (LIPITOR) 20 MG tablet TAKE 1 TABLET(20 MG) BY MOUTH DAILY   Continuous Blood Gluc Transmit (DEXCOM G6 TRANSMITTER) MISC CHECK SUGAR CONTINUOUSLY   Continuous Glucose Sensor (DEXCOM G6 SENSOR) MISC USE TO CHECK BLOOD SUGAR CONTINUOUSLY, CHANGE EVERY 10 DAYS   insulin glargine (LANTUS SOLOSTAR) 100 UNIT/ML Solostar Pen Inject 43 Units into the skin daily. Please wait until March 02, 2022 to fill   Insulin Pen Needle (BD PEN NEEDLE NANO U/F) 32G X 4 MM MISC USE TO INJECT INSULIN DAILY   metFORMIN (GLUCOPHAGE-XR) 500 MG 24 hr tablet TAKE 2 TABLETS(1000 MG) BY MOUTH DAILY WITH BREAKFAST   Multiple Vitamin (MULTIVITAMIN) capsule Take 1 capsule by mouth daily.   Semaglutide, 2 MG/DOSE, 8 MG/3ML SOPN Inject 2 mg as directed once a week.     Allergies:   Amoxicillin and Penicillins   Social History   Socioeconomic History   Marital status: Married    Spouse name: Not on file   Number of children: 2   Years of education: Not on file   Highest education level: Some college, no degree  Occupational History   Occupation: Scientist, water quality  Tobacco Use   Smoking status: Former    Current packs/day: 0.00    Types: Cigarettes  Quit date: 2007    Years since quitting: 17.6   Smokeless tobacco: Never  Vaping Use   Vaping status: Never Used  Substance and Sexual Activity   Alcohol use: Yes    Comment: Occasional   Drug use: No   Sexual activity: Not on file  Other Topics Concern   Not on file  Social History Narrative   Regular exercise-yes, walks around twice a week      Diet: 3 meals, limited carbs, water, fruits and veggies   Social Determinants of Health   Financial Resource Strain: Low Risk  (08/24/2022)   Overall Financial Resource Strain (CARDIA)    Difficulty of Paying Living Expenses: Not hard at all  Food Insecurity: No Food Insecurity (08/24/2022)   Hunger Vital Sign    Worried About Running Out of Food in the Last Year: Never true     Ran Out of Food in the Last Year: Never true  Transportation Needs: No Transportation Needs (08/24/2022)   PRAPARE - Administrator, Civil Service (Medical): No    Lack of Transportation (Non-Medical): No  Physical Activity: Insufficiently Active (08/24/2022)   Exercise Vital Sign    Days of Exercise per Week: 2 days    Minutes of Exercise per Session: 30 min  Stress: Stress Concern Present (08/24/2022)   [employer] of Occupational Health - Occupational Stress Questionnaire    Feeling of Stress : Very much  Social Connections: Moderately Integrated (08/24/2022)   Social Connection and Isolation Panel [NHANES]    Frequency of Communication with Friends and Family: Once a week    Frequency of Social Gatherings with Friends and Family: Twice a week    Attends Religious Services: 1 to 4 times per year    Active Member of [employer] or Organizations: No    Attends Engineer, structural: Not on file    Marital Status: Married     Family History: The patient's family history includes Anxiety disorder in her mother; Arrhythmia in her mother; Atrial fibrillation in her mother; Cancer (age of onset: 77) in her paternal grandmother; Heart attack in her father; Lymphoma in her paternal grandfather.  ROS:   Please see the history of present illness.     All other systems reviewed and are negative.  EKGs/Labs/Other Studies Reviewed:    The following studies were reviewed today:  EKG Interpretation Date/Time:  Friday November 06 2022 14:05:43 EDT Ventricular Rate:  94 PR Interval:  136 QRS Duration:  90 QT Interval:  336 QTC Calculation: 420 R Axis:   64  Text Interpretation: Normal sinus rhythm with sinus arrhythmia Normal ECG Confirmed by Debbe Odea ([id number]) on 11/06/2022 2:15:54 PM    Recent Labs: 08/27/2022: ALT 17; BUN 9; Creatinine, Ser 0.59; Hemoglobin 13.6; Platelets 312.0; Potassium 4.1; Sodium 136; TSH 1.50  Recent Lipid Panel    Component Value  Date/Time   CHOL 181 01/26/2022 0919   TRIG 113.0 01/26/2022 0919   HDL 74.70 01/26/2022 0919   CHOLHDL 2 01/26/2022 0919   VLDL 22.6 01/26/2022 0919   LDLCALC 84 01/26/2022 0919   LDLDIRECT 127.0 09/10/2011 0840     Risk Assessment/Calculations:    HYPERTENSION CONTROL Vitals:   11/06/22 1401 11/06/22 1407 11/06/22 1408  BP: (!) 162/80 (!) 160/76 (!) 148/82    The patient's blood pressure is elevated above target today.  In order to address the patient's elevated BP: Blood pressure will be monitored at home to determine if medication changes need to  be made.            Physical Exam:    VS:  BP (!) 148/82 (BP Location: Left Arm, Patient Position: Sitting, Cuff Size: Normal)   Pulse 92   Ht 5\' 6"  (1.676 m)   Wt 137 lb (62.1 kg)   SpO2 100%   BMI 22.11 kg/m     Wt Readings from Last 3 Encounters:  11/06/22 137 lb (62.1 kg)  08/27/22 139 lb 4 oz (63.2 kg)  01/29/22 159 lb 12.8 oz (72.5 kg)     GEN:  Well nourished, well developed in no acute distress HEENT: Normal NECK: No JVD; No carotid bruits CARDIAC: RRR, no murmurs, rubs, gallops RESPIRATORY:  Clear to auscultation without rales, wheezing or rhonchi  ABDOMEN: Soft, non-tender, non-distended MUSCULOSKELETAL:  No edema; No deformity  SKIN: Warm and dry NEUROLOGIC:  Alert and oriented x 3 PSYCHIATRIC:  Normal affect   ASSESSMENT:    1. Syncope, unspecified syncope type   2. Pure hypercholesterolemia   3. Elevated BP without diagnosis of hypertension    PLAN:    In order of problems listed above:  Syncope, etiology unclear.  Single episode.  Unsure if this was related to dietary intake at the time.  Place cardiac monitor, obtain echo to rule out any cardiac etiologies. Hyperlipidemia, cholesterol control, continue Lipitor 20 mg daily. Elevated BP, blood pressure usually controlled.  Monitor off BP meds.  Follow-up after cardiac testing      Medication Adjustments/Labs and Tests Ordered: Current  medicines are reviewed at length with the patient today.  Concerns regarding medicines are outlined above.  Orders Placed This Encounter  Procedures   LONG TERM MONITOR (3-14 DAYS)   EKG 12-Lead   ECHOCARDIOGRAM COMPLETE   No orders of the defined types were placed in this encounter.   Patient Instructions  Medication Instructions:   Your physician recommends that you continue on your current medications as directed. Please refer to the Current Medication list given to you today.  *If you need a refill on your cardiac medications before your next appointment, please call your pharmacy*   Lab Work:  None Ordered  If you have labs (blood work) drawn today and your tests are completely normal, you will receive your results only by: MyChart Message (if you have MyChart) OR A paper copy in the mail If you have any lab test that is abnormal or we need to change your treatment, we will call you to review the results.   Testing/Procedures:  Your physician has requested that you have an echocardiogram. Echocardiography is a painless test that uses sound waves to create images of your heart. It provides your doctor with information about the size and shape of your heart and how well your heart's chambers and valves are working. This procedure takes approximately one hour. There are no restrictions for this procedure. Please do NOT wear cologne, perfume, aftershave, or lotions (deodorant is allowed). Please arrive 15 minutes prior to your appointment time.  Your physician has recommended that you wear a Zio monitor.   This monitor is a medical device that records the heart's electrical activity. Doctors most often use these monitors to diagnose arrhythmias. Arrhythmias are problems with the speed or rhythm of the heartbeat. The monitor is a small device applied to your chest. You can wear one while you do your normal daily activities. While wearing this monitor if you have any symptoms to  push the button and record what you felt.  Once you have worn this monitor for the period of time provider prescribed (Usually 14 days), you will return the monitor device in the postage paid box. Once it is returned they will download the data collected and provide [place] with a report which the provider will then review and we will call you with those results. Important tips:  Avoid showering during the first 24 hours of wearing the monitor. Avoid excessive sweating to help maximize wear time. Do not submerge the device, no hot tubs, and no swimming pools. Keep any lotions or oils away from the patch. After 24 hours you may shower with the patch on. Take brief showers with your back facing the shower head.  Do not remove patch once it has been placed because that will interrupt data and decrease adhesive wear time. Push the button when you have any symptoms and write down what you were feeling. Once you have completed wearing your monitor, remove and place into box which has postage paid and place in your outgoing mailbox.  If for some reason you have misplaced your box then call our office and we can provide another box and/or mail it off for you.     Follow-Up: At [facility], you and your health needs are our priority.  As part of our continuing mission to provide you with exceptional heart care, we have created designated Provider Care Teams.  These Care Teams include your primary Cardiologist (physician) and Advanced Practice Providers (APPs -  Physician Assistants and Nurse Practitioners) who all work together to provide you with the care you need, when you need it.  We recommend signing up for the patient portal called "MyChart".  Sign up information is provided on this After Visit Summary.  MyChart is used to connect with patients for Virtual Visits (Telemedicine).  Patients are able to view lab/test results, encounter notes, upcoming appointments, etc.  Non-urgent messages can be  sent to your provider as well.   To learn more about what you can do with MyChart, go to [email or web address].    Your next appointment:    After Cardiac testing   Provider:   You may see Debbe Odea, MD or one of the following Advanced Practice Providers on your designated Care Team:   Nicolasa Ducking, NP Eula Listen, PA-C Cadence Fransico Michael, PA-C Charlsie Quest, NP   Signed, Debbe Odea, MD  11/06/2022 2:54 PM    [facility]

## 2022-11-11 DIAGNOSIS — R55 Syncope and collapse: Secondary | ICD-10-CM

## 2022-12-02 ENCOUNTER — Ambulatory Visit: Payer: No Typology Code available for payment source | Attending: Cardiology

## 2022-12-02 DIAGNOSIS — R55 Syncope and collapse: Secondary | ICD-10-CM | POA: Diagnosis not present

## 2022-12-02 LAB — ECHOCARDIOGRAM COMPLETE
Area-P 1/2: 4.31 cm2
S' Lateral: 3 cm

## 2022-12-04 LAB — COLOGUARD: Cologuard: NEGATIVE

## 2022-12-10 ENCOUNTER — Encounter: Payer: Self-pay | Admitting: Cardiology

## 2022-12-10 ENCOUNTER — Ambulatory Visit: Payer: No Typology Code available for payment source | Attending: Cardiology | Admitting: Cardiology

## 2022-12-10 VITALS — BP 136/79 | HR 88 | Ht 66.0 in | Wt 133.4 lb

## 2022-12-10 DIAGNOSIS — E785 Hyperlipidemia, unspecified: Secondary | ICD-10-CM

## 2022-12-10 DIAGNOSIS — R002 Palpitations: Secondary | ICD-10-CM

## 2022-12-10 DIAGNOSIS — Z794 Long term (current) use of insulin: Secondary | ICD-10-CM

## 2022-12-10 DIAGNOSIS — R55 Syncope and collapse: Secondary | ICD-10-CM | POA: Diagnosis not present

## 2022-12-10 DIAGNOSIS — E11319 Type 2 diabetes mellitus with unspecified diabetic retinopathy without macular edema: Secondary | ICD-10-CM

## 2022-12-10 DIAGNOSIS — E1169 Type 2 diabetes mellitus with other specified complication: Secondary | ICD-10-CM | POA: Diagnosis not present

## 2022-12-10 NOTE — Patient Instructions (Signed)
Medication Instructions:  Your physician recommends that you continue on your current medications as directed. Please refer to the Current Medication list given to you today.  *If you need a refill on your cardiac medications before your next appointment, please call your pharmacy*  Lab Work: -None ordered  Testing/Procedures: -None ordered  Follow-Up: At [facility], you and your health needs are our priority.  As part of our continuing mission to provide you with exceptional heart care, we have created designated Provider Care Teams.  These Care Teams include your primary Cardiologist (physician) and Advanced Practice Providers (APPs -  Physician Assistants and Nurse Practitioners) who all work together to provide you with the care you need, when you need it.  Your next appointment:   4- 5  month(s)  Provider:   Charlsie Quest, NP    Other Instructions -None

## 2022-12-10 NOTE — Progress Notes (Signed)
Cardiology Office Note:  .   Date:  12/10/2022  ID:  [name], DOB [birth year], MRN [id number] PCP: Excell Seltzer, MD  [facility] Providers Cardiologist:  Debbe Odea, MD    History of Present Illness: .   [name] is a 52 y.o. female with a past medical history of hyperlipidemia, diabetes, polycystic ovarian disease, iron deficiency anemia, family history of early CAD, diabetic retinopathy, who was recently evaluated for syncope and tachycardia, who is here today for follow-up.  Patient was previously seen in clinic 11/06/2022 by Dr.Agbor-Etang.  She had been referred by her PCP for evaluation of syncope and tachycardia.  Patient stated approximately 2 to 3 months ago she had passed out while at a restaurant.  She states she was having a cocktail, eating some sushi prior and was feeling little dizzy.  She walked to the waiting area on her way out to get some air and she suddenly passed out.  EMS was called rhythm showed sinus tachycardia with an average heart rate of 114 bpm.  Blood sugar remained in the 140s.  She felt better few minutes later declined to go to the emergency department for evaluation.  Follow-up with her PCP recommended cardiac eval.  She denied any symptoms of syncope since that time.  She was placed on a heart monitor and scheduled for an echocardiogram.  Echocardiogram revealed an LVEF of 60 to 65%, no RWMA, mild MR.  Heart monitor revealed 2 episodes of nonsustained V. tach lasting 4 beats and 6 beats.Significant sustained arrhythmias were noted.  No findings to suggest etiology of syncope.  She returns to clinic today stating that she has been doing well.  Unfortunately she has had no further episodes of lightheadedness/dizziness or syncope.  She does note occasional palpitations that she states she has had for years. She has had outpatient testing completed which no findings to suggest etiology of her syncopal episode.  She states that she has been  compliant with her medication and has frequent follow-ups with her PCP due to her diabetes.  She states her sugars have been well-controlled as well as her blood pressure has been normal.  She notes some extra stress in her household with caring for her mother-in-law who has dementia.  Denies any recent hospitalizations or visits to the emergency department.  ROS: 10 point review of systems has been reviewed and considered negative with exception what is been listed in the HPI  Studies Reviewed: .       Heart Monitor 12/04/22 Patient had a min HR of 53 bpm, max HR of 190 bpm, and avg HR of 92 bpm. Predominant underlying rhythm was Sinus Rhythm. 2 Ventricular Tachycardia runs occurred, the run with the fastest interval lasting 4 beats with a max rate of 190 bpm, the longest  lasting 6 beats with an avg rate of 135 bpm. Ventricular Tachycardia was detected within +/- 45 seconds of symptomatic patient event(s). Isolated SVEs were rare (<1.0%), SVE Couplets were rare (<1.0%), and SVE Triplets were rare (<1.0%). Isolated VEs  were rare (<1.0%), VE Couplets were rare (<1.0%), and no VE Triplets were present. Ventricular Bigeminy was present.    Conclusion 2 episodes of nonsustained VT lasting 4 beats and 6 beats. No significant sustained arrhythmias noted No findings to suggest etiology of syncope.  TTE 12/02/22 1. Left ventricular ejection fraction, by estimation, is 60 to 65%. Left  ventricular ejection fraction by 3D volume is 58 %. Left ventricular  ejection fraction  by PLAX is 58 %. The left ventricle has normal function.  The left ventricle has no regional  wall motion abnormalities. Left ventricular diastolic parameters were  normal. The average left ventricular global longitudinal strain is -16.9  %. The global longitudinal strain is normal.   2. Right ventricular systolic function is normal. The right ventricular  size is normal. Tricuspid regurgitation signal is inadequate for assessing   PA pressure.   3. The mitral valve is normal in structure. Mild mitral valve  regurgitation. No evidence of mitral stenosis.   4. The aortic valve is tricuspid. Aortic valve regurgitation is not  visualized. No aortic stenosis is present.   5. The inferior vena cava is normal in size with greater than 50%  respiratory variability, suggesting right atrial pressure of 3 mmHg.   Risk Assessment/Calculations:             Physical Exam:   VS:  BP 136/79 (BP Location: Left Arm, Patient Position: Sitting, Cuff Size: Normal)   Pulse 88   Ht 5\' 6"  (1.676 m)   Wt 133 lb 6.4 oz (60.5 kg)   SpO2 99%   BMI 21.53 kg/m    Wt Readings from Last 3 Encounters:  12/10/22 133 lb 6.4 oz (60.5 kg)  11/06/22 137 lb (62.1 kg)  08/27/22 139 lb 4 oz (63.2 kg)    GEN: Well nourished, well developed in no acute distress NECK: No JVD; No carotid bruits CARDIAC: RRR, no murmurs, rubs, gallops RESPIRATORY:  Clear to auscultation without rales, wheezing or rhonchi  ABDOMEN: Soft, non-tender, non-distended EXTREMITIES:  No edema; No deformity   ASSESSMENT AND PLAN: .   Previous syncopal episode with unclear etiology.  Single isolated episode.  Show unsure if it was related to dietary intake at that time as she has had substantial weight loss since being on Ozempic.  Echocardiogram completed revealed an LVEF of 60 to 65%, no RWMA, mild mitral regurgitation.  She also wore a heart monitor that showed 2 episodes of nonsustained V. tach lasting 4 beats and 6 beats, no significant sustained arrhythmia, and no findings to suggest an etiology of her syncopal episode.  Hyperlipidemia her cholesterol is been under control she did continue to 20 mg daily.  This continues to be managed by her PCP.  Palpitations that she states have happened for years.  She does not have a large caffeine intake.  Did discuss triggers for fatigue, stress, dehydration, and caffeine.  Also discussed potential role of beta-blockers and  palpitations as well as elevated heart rates on occasion.  At this time we have discussed to defer initiation of any beta-blocker therapy we will monitor for now.  Type 2 diabetes which she is on metformin and insulin.  This continues to be managed by her PCP.    Dispo: Patient to return to clinic to see MD/APP in 4 to 5 months or sooner if needed for reevaluation of symptoms  Signed, Yenifer Saccente, NP

## 2022-12-14 LAB — COLOGUARD: COLOGUARD: NEGATIVE

## 2022-12-14 LAB — EXTERNAL GENERIC LAB PROCEDURE: COLOGUARD: NEGATIVE

## 2023-01-22 ENCOUNTER — Other Ambulatory Visit: Payer: Self-pay | Admitting: Family Medicine

## 2023-01-22 NOTE — Telephone Encounter (Signed)
LVM for patient tcb and schedule

## 2023-01-22 NOTE — Telephone Encounter (Signed)
Please call and schedule CPE with fasting labs prior with Dr. Ermalene Searing.

## 2023-02-23 ENCOUNTER — Other Ambulatory Visit: Payer: Self-pay | Admitting: Family Medicine

## 2023-03-11 ENCOUNTER — Encounter: Payer: No Typology Code available for payment source | Admitting: Family Medicine

## 2023-03-19 ENCOUNTER — Encounter: Payer: Self-pay | Admitting: Family Medicine

## 2023-03-19 ENCOUNTER — Ambulatory Visit (INDEPENDENT_AMBULATORY_CARE_PROVIDER_SITE_OTHER): Payer: No Typology Code available for payment source | Admitting: Family Medicine

## 2023-03-19 VITALS — BP 110/72 | HR 113 | Temp 98.7°F | Ht 65.25 in | Wt 128.2 lb

## 2023-03-19 DIAGNOSIS — Z794 Long term (current) use of insulin: Secondary | ICD-10-CM | POA: Diagnosis not present

## 2023-03-19 DIAGNOSIS — Z23 Encounter for immunization: Secondary | ICD-10-CM

## 2023-03-19 DIAGNOSIS — E785 Hyperlipidemia, unspecified: Secondary | ICD-10-CM

## 2023-03-19 DIAGNOSIS — E1169 Type 2 diabetes mellitus with other specified complication: Secondary | ICD-10-CM | POA: Diagnosis not present

## 2023-03-19 DIAGNOSIS — Z114 Encounter for screening for human immunodeficiency virus [HIV]: Secondary | ICD-10-CM

## 2023-03-19 DIAGNOSIS — E11319 Type 2 diabetes mellitus with unspecified diabetic retinopathy without macular edema: Secondary | ICD-10-CM

## 2023-03-19 MED ORDER — METFORMIN HCL ER 500 MG PO TB24: ORAL_TABLET | ORAL | 3 refills | Status: DC

## 2023-03-19 MED ORDER — DEXCOM G6 TRANSMITTER MISC: 3 refills | Status: AC

## 2023-03-19 MED ORDER — OZEMPIC (2 MG/DOSE) 8 MG/3ML ~~LOC~~ SOPN: PEN_INJECTOR | SUBCUTANEOUS | 3 refills | Status: DC

## 2023-03-19 MED ORDER — ATORVASTATIN CALCIUM 20 MG PO TABS: ORAL_TABLET | ORAL | 3 refills | Status: DC

## 2023-03-19 MED ORDER — LANTUS SOLOSTAR 100 UNIT/ML ~~LOC~~ SOPN: 25.0000 [IU] | PEN_INJECTOR | Freq: Every day | SUBCUTANEOUS | Status: AC

## 2023-03-19 NOTE — Addendum Note (Signed)
Addended by: Lovena Neighbours on: 03/19/2023 03:35 PM   Modules accepted: Orders

## 2023-03-19 NOTE — Progress Notes (Signed)
Patient ID: [name], female    DOB: 26-Apr-1970, 53 y.o.   MRN: [id number]  This visit was conducted in person.  BP 110/72 (BP Location: Left Arm, Patient Position: Sitting, Cuff Size: Normal)   Pulse (!) 113   Temp 98.7 F (37.1 C) (Temporal)   Ht 5' 5.25" (1.657 m)   Wt 128 lb 4 oz (58.2 kg)   LMP 03/09/2023   SpO2 99%   BMI 21.18 kg/m    CC:  Chief Complaint  Patient presents with   Annual Exam    Subjective:   HPI: [name] is a 53 y.o. female presenting on 03/19/2023 for Annual Exam The patient presents for complete physical and review of chronic health problems. He/She also has the following acute concerns today:  Diabetes:  Due for re-eval. She is now on Ozempic 2 mg weekly, metformin 500 mg XR 2 tabs daily and Lantus 25 units daily She has  continued to lose weight! Lab Results  Component Value Date   HGBA1C 6.6 (A) 08/27/2022  Using medications without difficulties: Hypoglycemic episodes: occ.. less with lower dose of Lantus. Hyperglycemic episodes:rare Feet problems: none Blood Sugars averaging:  using continuous glucose monitor, FBS 75-150.. higher with menses. eye exam within last year:  none Associated with diabetic retinopathy Urine microalbumin test within normal range Wt Readings from Last 3 Encounters:  03/19/23 128 lb 4 oz (58.2 kg)  12/10/22 133 lb 6.4 oz (60.5 kg)  11/06/22 137 lb (62.1 kg)  Body mass index is 21.18 kg/m.  BP Readings from Last 3 Encounters:  03/19/23 110/72  12/10/22 136/79  11/06/22 (!) 148/82   HR elevated today. Baseline is 88-97   Due for re-eval of cholesterol etc.       Relevant past medical, surgical, family and social history reviewed and updated as indicated. Interim medical history since our last visit reviewed. Allergies and medications reviewed and updated. Outpatient Medications Prior to Visit  Medication Sig Dispense Refill   Continuous Glucose Sensor (DEXCOM G6 SENSOR) MISC USE TO  CHECK BLOOD SUGAR CONTINUOUSLY, CHANGE EVERY 10 DAYS 3 each 11   Insulin Pen Needle (BD PEN NEEDLE NANO U/F) 32G X 4 MM MISC USE TO INJECT INSULIN DAILY 100 each 3   Multiple Vitamin (MULTIVITAMIN) capsule Take 1 capsule by mouth daily.     atorvastatin (LIPITOR) 20 MG tablet TAKE 1 TABLET(20 MG) BY MOUTH DAILY 90 tablet 3   Continuous Blood Gluc Transmit (DEXCOM G6 TRANSMITTER) MISC CHECK SUGAR CONTINUOUSLY 1 each 3   insulin glargine (LANTUS SOLOSTAR) 100 UNIT/ML Solostar Pen Inject 47 Units into the skin daily. (Patient taking differently: Inject 20-25 Units into the skin daily.) 15 mL 0   metFORMIN (GLUCOPHAGE-XR) 500 MG 24 hr tablet TAKE 2 TABLETS(1000 MG) BY MOUTH DAILY WITH BREAKFAST 180 tablet 3   OZEMPIC, 2 MG/DOSE, 8 MG/3ML SOPN INJECT 2 MG UNDER THE SKIN ONCE A WEEK 3 mL 0   No facility-administered medications prior to visit.     Per HPI unless specifically indicated in ROS section below Review of Systems  Constitutional:  Negative for fatigue and fever.  HENT:  Negative for congestion.   Eyes:  Negative for pain.  Respiratory:  Negative for cough and shortness of breath.   Cardiovascular:  Negative for chest pain, palpitations and leg swelling.  Gastrointestinal:  Negative for abdominal pain.  Genitourinary:  Negative for dysuria and vaginal bleeding.  Musculoskeletal:  Negative for back pain.  Neurological:  Negative  for syncope, light-headedness and headaches.  Psychiatric/Behavioral:  Negative for dysphoric mood.    Objective:  BP 110/72 (BP Location: Left Arm, Patient Position: Sitting, Cuff Size: Normal)   Pulse (!) 113   Temp 98.7 F (37.1 C) (Temporal)   Ht 5' 5.25" (1.657 m)   Wt 128 lb 4 oz (58.2 kg)   LMP 03/09/2023   SpO2 99%   BMI 21.18 kg/m   Wt Readings from Last 3 Encounters:  03/19/23 128 lb 4 oz (58.2 kg)  12/10/22 133 lb 6.4 oz (60.5 kg)  11/06/22 137 lb (62.1 kg)      Physical Exam Vitals and nursing note reviewed.  Constitutional:       General: She is not in acute distress.    Appearance: Normal appearance. She is well-developed. She is not ill-appearing or toxic-appearing.  HENT:     Head: Normocephalic.     Right Ear: Hearing, tympanic membrane, ear canal and external ear normal.     Left Ear: Hearing, tympanic membrane, ear canal and external ear normal.     Nose: Nose normal.  Eyes:     General: Lids are normal. Lids are everted, no foreign bodies appreciated.     Conjunctiva/sclera: Conjunctivae normal.     Pupils: Pupils are equal, round, and reactive to light.  Neck:     Thyroid: No thyroid mass or thyromegaly.     Vascular: No carotid bruit.     Trachea: Trachea normal.  Cardiovascular:     Rate and Rhythm: Normal rate and regular rhythm.     Heart sounds: Normal heart sounds, S1 normal and S2 normal. No murmur heard.    No gallop.  Pulmonary:     Effort: Pulmonary effort is normal. No respiratory distress.     Breath sounds: Normal breath sounds. No wheezing, rhonchi or rales.  Abdominal:     General: Bowel sounds are normal. There is no distension or abdominal bruit.     Palpations: Abdomen is soft. There is no fluid wave or mass.     Tenderness: There is no abdominal tenderness. There is no guarding or rebound.     Hernia: No hernia is present.  Musculoskeletal:     Cervical back: Normal range of motion and neck supple.  Lymphadenopathy:     Cervical: No cervical adenopathy.  Skin:    General: Skin is warm and dry.     Findings: No rash.  Neurological:     Mental Status: She is alert.     Cranial Nerves: No cranial nerve deficit.     Sensory: No sensory deficit.  Psychiatric:        Mood and Affect: Mood is not anxious or depressed.        Speech: Speech normal.        Behavior: Behavior normal. Behavior is cooperative.        Judgment: Judgment normal.    Diabetic foot exam: Normal inspection No skin breakdown No calluses  Normal DP pulses Normal sensation to light touch and  monofilament Nails normal     Results for orders placed or performed in visit on 03/19/23  Cologuard   Collection Time: 12/04/22 12:00 AM  Result Value Ref Range   Cologuard Negative Negative     COVID 19 screen:  No recent travel or known exposure to COVID19 The patient denies respiratory symptoms of COVID 19 at this time. The importance of social distancing was discussed today.   Assessment and Plan   The  patient's preventative maintenance and recommended screening tests for an annual wellness exam were reviewed in full today. Brought up to date unless services declined.  Counselled on the importance of diet, exercise, and its role in overall health and mortality. The patient's FH and SH was reviewed, including their home life, tobacco status, and drug and alcohol status.   DVE/PAP: nml pap 2021, neg HPV on q5 schedule  Vaccines: Td, uptodate. PNA vaccine uptodate.  Flu given today No family ovarian and uterine cancer.  Mammo: 06/2022 Remote former smoker  Refused STD screen.   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      Relevant Medications   insulin glargine (LANTUS SOLOSTAR) 100 UNIT/ML Solostar Pen   atorvastatin (LIPITOR) 20 MG tablet   metFORMIN (GLUCOPHAGE-XR) 500 MG 24 hr tablet   Semaglutide, 2 MG/DOSE, (OZEMPIC, 2 MG/DOSE,) 8 MG/3ML SOPN   Other Relevant Orders   Hemoglobin A1c   Microalbumin / creatinine urine ratio   Diabetic retinopathy associated with controlled type 2 diabetes mellitus (HCC)   Chronic, stable followed by ophthalmology      Relevant Medications   insulin glargine (LANTUS SOLOSTAR) 100 UNIT/ML Solostar Pen   atorvastatin (LIPITOR) 20 MG tablet   metFORMIN (GLUCOPHAGE-XR) 500 MG 24 hr tablet   Semaglutide, 2 MG/DOSE, (OZEMPIC, 2 MG/DOSE,) 8 MG/3ML SOPN   Hyperlipidemia  associated with type 2 diabetes mellitus (HCC)   Due for re-eval.      Relevant Medications   insulin glargine (LANTUS SOLOSTAR) 100 UNIT/ML Solostar Pen   atorvastatin (LIPITOR) 20 MG tablet   metFORMIN (GLUCOPHAGE-XR) 500 MG 24 hr tablet   Semaglutide, 2 MG/DOSE, (OZEMPIC, 2 MG/DOSE,) 8 MG/3ML SOPN   Other Relevant Orders   Lipid panel   Comprehensive metabolic panel   Other Visit Diagnoses       Need for influenza vaccination       Relevant Orders   Flu vaccine trivalent PF, 6mos and older(Flulaval,Afluria,Fluarix,Fluzone) (Completed)     Screening for HIV (human immunodeficiency virus)       Relevant Orders   HIV Antibody (routine testing w rflx)      Meds ordered this encounter  Medications   insulin glargine (LANTUS SOLOSTAR) 100 UNIT/ML Solostar Pen    Sig: Inject 25 Units into the skin daily.   atorvastatin (LIPITOR) 20 MG tablet    Sig: TAKE 1 TABLET(20 MG) BY MOUTH DAILY    Dispense:  90 tablet    Refill:  3   Continuous Glucose Transmitter (DEXCOM G6 TRANSMITTER) MISC    Sig: Check sugar continuously    Dispense:  1 each    Refill:  3   metFORMIN (GLUCOPHAGE-XR) 500 MG 24 hr tablet    Sig: TAKE 2 TABLETS(1000 MG) BY MOUTH DAILY WITH BREAKFAST    Dispense:  180 tablet    Refill:  3   Semaglutide, 2 MG/DOSE, (OZEMPIC, 2 MG/DOSE,) 8 MG/3ML SOPN    Sig: INJECT 2 MG UNDER THE SKIN ONCE A WEEK    Dispense:  9 mL    Refill:  3   EKG: unchanged from previous tracings compared to 2018 normal coordinator will get you scheduled for already did you see [place] access Thank you Chairs there are no call yet for the lab okay, sinus tachycardia.  Kerby Nora, MD

## 2023-03-19 NOTE — Assessment & Plan Note (Signed)
Chronic, stable followed by ophthalmology 

## 2023-03-19 NOTE — Assessment & Plan Note (Signed)
Due for re-eval. 

## 2023-03-19 NOTE — Assessment & Plan Note (Signed)
Due fpor re-eval. 

## 2023-03-20 LAB — COMPREHENSIVE METABOLIC PANEL
AG Ratio: 2 (calc) (ref 1.0–2.5)
ALT: 20 U/L (ref 6–29)
AST: 17 U/L (ref 10–35)
Albumin: 4.3 g/dL (ref 3.6–5.1)
Alkaline phosphatase (APISO): 43 U/L (ref 37–153)
BUN: 12 mg/dL (ref 7–25)
CO2: 25 mmol/L (ref 20–32)
Calcium: 9.1 mg/dL (ref 8.6–10.4)
Chloride: 103 mmol/L (ref 98–110)
Creat: 0.66 mg/dL (ref 0.50–1.03)
Globulin: 2.2 g/dL (ref 1.9–3.7)
Glucose, Bld: 202 mg/dL — ABNORMAL HIGH (ref 65–99)
Potassium: 3.8 mmol/L (ref 3.5–5.3)
Sodium: 139 mmol/L (ref 135–146)
Total Bilirubin: 0.7 mg/dL (ref 0.2–1.2)
Total Protein: 6.5 g/dL (ref 6.1–8.1)

## 2023-03-20 LAB — MICROALBUMIN / CREATININE URINE RATIO
Creatinine, Urine: 284 mg/dL — ABNORMAL HIGH (ref 20–275)
Microalb Creat Ratio: 7 mg/g{creat} (ref <30)
Microalb, Ur: 2.1 mg/dL

## 2023-03-20 LAB — HEMOGLOBIN A1C
Hgb A1c MFr Bld: 7.2 %{Hb} — ABNORMAL HIGH (ref <5.7)
Mean Plasma Glucose: 160 mg/dL
eAG (mmol/L): 8.9 mmol/L

## 2023-03-20 LAB — LIPID PANEL
Cholesterol: 144 mg/dL (ref <200)
HDL: 70 mg/dL (ref >=50)
LDL Cholesterol (Calc): 55 mg/dL
Non-HDL Cholesterol (Calc): 74 mg/dL (ref <130)
Total CHOL/HDL Ratio: 2.1 (calc) (ref <5.0)
Triglycerides: 100 mg/dL (ref <150)

## 2023-03-20 LAB — HIV ANTIBODY (ROUTINE TESTING W REFLEX): HIV 1&2 Ab, 4th Generation: NONREACTIVE

## 2023-03-22 ENCOUNTER — Other Ambulatory Visit: Payer: Self-pay | Admitting: Family Medicine

## 2023-03-24 ENCOUNTER — Encounter: Payer: Self-pay | Admitting: Family Medicine

## 2023-03-25 ENCOUNTER — Other Ambulatory Visit: Payer: Self-pay | Admitting: Family Medicine

## 2023-04-23 LAB — HM DIABETES EYE EXAM

## 2023-05-25 ENCOUNTER — Ambulatory Visit: Payer: No Typology Code available for payment source | Admitting: Cardiology

## 2023-06-15 ENCOUNTER — Ambulatory Visit: Attending: Cardiology | Admitting: Cardiology

## 2023-06-15 ENCOUNTER — Encounter: Payer: Self-pay | Admitting: Cardiology

## 2023-06-15 VITALS — BP 90/62 | HR 92 | Ht 65.5 in | Wt 126.0 lb

## 2023-06-15 DIAGNOSIS — Z794 Long term (current) use of insulin: Secondary | ICD-10-CM

## 2023-06-15 DIAGNOSIS — E785 Hyperlipidemia, unspecified: Secondary | ICD-10-CM

## 2023-06-15 DIAGNOSIS — E11319 Type 2 diabetes mellitus with unspecified diabetic retinopathy without macular edema: Secondary | ICD-10-CM

## 2023-06-15 DIAGNOSIS — E1169 Type 2 diabetes mellitus with other specified complication: Secondary | ICD-10-CM

## 2023-06-15 DIAGNOSIS — Z87898 Personal history of other specified conditions: Secondary | ICD-10-CM

## 2023-06-15 DIAGNOSIS — R002 Palpitations: Secondary | ICD-10-CM

## 2023-06-15 NOTE — Patient Instructions (Signed)
 Medication Instructions:  Your physician recommends that you continue on your current medications as directed. Please refer to the Current Medication list given to you today.   *If you need a refill on your cardiac medications before your next appointment, please call your pharmacy*   Lab Work: None    If you have labs (blood work) drawn today and your tests are completely normal, you will receive your results only by: MyChart Message (if you have MyChart) OR A paper copy in the mail If you have any lab test that is abnormal or we need to change your treatment, we will call you to review the results.   Testing/Procedures: None    Follow-Up: At [facility], you and your health needs are our priority.  As part of our continuing mission to provide you with exceptional heart care, we have created designated Provider Care Teams.  These Care Teams include your primary Cardiologist (physician) and Advanced Practice Providers (APPs -  Physician Assistants and Nurse Practitioners) who all work together to provide you with the care you need, when you need it.  We recommend signing up for the patient portal called "MyChart".  Sign up information is provided on this After Visit Summary.  MyChart is used to connect with patients for Virtual Visits (Telemedicine).  Patients are able to view lab/test results, encounter notes, upcoming appointments, etc.  Non-urgent messages can be sent to your provider as well.   To learn more about what you can do with MyChart, go to [email or web address].    Your next appointment:   1 year(s)  The format for your next appointment:   In Person  Provider:   You will see one of the following Advanced Practice Providers on your designated Care Team:   Laneta Pintos, NP Gildardo Labrador, PA-C Varney Gentleman, PA-C Cadence [place], PA-C Ronald Cockayne, NP Morey Ar, NP     Other Instructions

## 2023-06-15 NOTE — Progress Notes (Signed)
 Cardiology Office Note:  .   Date:  06/15/2023  ID:  [name], DOB [birth year], MRN [id number] PCP: Judithann Novas, MD  [facility] Providers Cardiologist:  Constancia Delton, MD    History of Present Illness: .   [name] is a 53 y.o. female with past medical history of hyperlipidemia, diabetes, polycystic ovarian disease, iron deficiency anemia, family history of early CAD, diabetic retinopathy, history of syncope, tachycardia, palpitations, who is here today for follow-up.   Patient was previously seen in clinic 11/06/2022 by Dr.Agbor-Etang.  She had been referred by her PCP for evaluation of syncope and tachycardia.  Patient stated approximately 2 to 3 months ago she had passed out while at a restaurant.  She states she was having a cocktail, eating some sushi prior and was feeling little dizzy.  She walked to the waiting area on her way out to get some air and she suddenly passed out.  EMS was called rhythm showed sinus tachycardia with an average heart rate of 114 bpm.  Blood sugar remained in the 140s.  She felt better few minutes later declined to go to the emergency department for evaluation.  Follow-up with her PCP recommended cardiac eval.  She denied any symptoms of syncope since that time.  She was placed on a heart monitor and scheduled for an echocardiogram.   Echocardiogram revealed an LVEF of 60 to 65%, no RWMA, mild MR.  Heart monitor revealed 2 episodes of nonsustained V. tach lasting 4 beats and 6 beats.Significant sustained arrhythmias were noted.  No findings to suggest etiology of syncope.   She was last seen in clinic 12/10/2018 worsening to be doing well.  She had no further episodes of lightheadedness/dizziness or syncope.  She did not note occasional palpitations that were unchanged.  There were no other medication changes that were made and further testing was ordered at the time.  She returns to clinic today stating that she has been doing well.  She  has not had any further episodes of lightheadedness/dizziness or syncope.  She does have occasional palpitations primarily related to her cycle.  She just recently got a new grandbaby approximately a month ago.  States that she has been compliant with her current medication regimen without any adverse effects.  Denies any hospitalizations or visits to the emergency department.   ROS: 10 point review of systems has been reviewed and considered negative except what is listed in the HPI  Studies Reviewed: .        Heart Monitor 12/04/22 Patient had a min HR of 53 bpm, max HR of 190 bpm, and avg HR of 92 bpm. Predominant underlying rhythm was Sinus Rhythm. 2 Ventricular Tachycardia runs occurred, the run with the fastest interval lasting 4 beats with a max rate of 190 bpm, the longest  lasting 6 beats with an avg rate of 135 bpm. Ventricular Tachycardia was detected within +/- 45 seconds of symptomatic patient event(s). Isolated SVEs were rare (<1.0%), SVE Couplets were rare (<1.0%), and SVE Triplets were rare (<1.0%). Isolated VEs  were rare (<1.0%), VE Couplets were rare (<1.0%), and no VE Triplets were present. Ventricular Bigeminy was present.    Conclusion 2 episodes of nonsustained VT lasting 4 beats and 6 beats. No significant sustained arrhythmias noted No findings to suggest etiology of syncope.   TTE 12/02/22 1. Left ventricular ejection fraction, by estimation, is 60 to 65%. Left  ventricular ejection fraction by 3D volume is 58 %. Left ventricular  ejection fraction by PLAX is 58 %. The left ventricle has normal function.  The left ventricle has no regional  wall motion abnormalities. Left ventricular diastolic parameters were  normal. The average left ventricular global longitudinal strain is -16.9  %. The global longitudinal strain is normal.   2. Right ventricular systolic function is normal. The right ventricular  size is normal. Tricuspid regurgitation signal is inadequate for  assessing  PA pressure.   3. The mitral valve is normal in structure. Mild mitral valve  regurgitation. No evidence of mitral stenosis.   4. The aortic valve is tricuspid. Aortic valve regurgitation is not  visualized. No aortic stenosis is present.   5. The inferior vena cava is normal in size with greater than 50%  respiratory variability, suggesting right atrial pressure of 3 mmHg. Risk Assessment/Calculations:             Physical Exam:   VS:  BP 90/62 (BP Location: Left Arm, Patient Position: Sitting, Cuff Size: Normal)   Pulse 92   Ht 5' 5.5" (1.664 m)   Wt 126 lb (57.2 kg)   SpO2 99%   BMI 20.65 kg/m    Wt Readings from Last 3 Encounters:  06/15/23 126 lb (57.2 kg)  03/19/23 128 lb 4 oz (58.2 kg)  12/10/22 133 lb 6.4 oz (60.5 kg)    GEN: Well nourished, well developed in no acute distress NECK: No JVD; No carotid bruits CARDIAC: RRR, no murmurs, rubs, gallops RESPIRATORY:  Clear to auscultation without rales, wheezing or rhonchi  ABDOMEN: Soft, non-tender, non-distended EXTREMITIES:  No edema; No deformity   ASSESSMENT AND PLAN: .   Palpitations that only on occasion.  She relates them to being during her cycle.  States they have been happening for years.  Will defer initiation of any beta-blocker therapy at this time.  Hyperlipidemia where she is continued on atorvastatin 20 mg daily.  Her last LDL was 55 from 03/19/2023.  Ongoing management per PCP.  History of syncope without reoccurrence.  Previous outpatient monitoring of echocardiogram and heart monitor were unrevealing.  Single isolated episode with no other events.  Type 2 diabetes with a slightly elevated hemoglobin A1c last was last month.  She is continued on metformin 500 mg 2 tablets at breakfast and Ozempic.  Ongoing management per PCP.       Dispo: Patient return to clinic to see MD/APP in 11 to 12 months or sooner if needed for evaluation of symptoms and repeat EKG.  Signed, Gussie Murton, NP

## 2023-06-24 LAB — HM MAMMOGRAPHY

## 2023-07-06 ENCOUNTER — Encounter: Payer: Self-pay | Admitting: Family Medicine

## 2023-10-23 ENCOUNTER — Other Ambulatory Visit: Payer: Self-pay | Admitting: Family Medicine

## 2023-10-25 NOTE — Telephone Encounter (Signed)
Lvm/sent mychart

## 2023-10-25 NOTE — Telephone Encounter (Signed)
 Please schedule diabetes follow up with Dr. Avelina.  She was due to follow up in July.

## 2023-11-12 ENCOUNTER — Ambulatory Visit: Admitting: Family Medicine

## 2023-11-24 ENCOUNTER — Other Ambulatory Visit: Payer: Self-pay | Admitting: Family Medicine

## 2023-12-03 ENCOUNTER — Ambulatory Visit: Admitting: Family Medicine

## 2023-12-10 ENCOUNTER — Ambulatory Visit: Admitting: Family Medicine

## 2023-12-15 ENCOUNTER — Ambulatory Visit: Admitting: Family Medicine

## 2023-12-15 VITALS — BP 102/62 | HR 97 | Temp 98.5°F | Ht 65.25 in | Wt 123.8 lb

## 2023-12-15 DIAGNOSIS — Z23 Encounter for immunization: Secondary | ICD-10-CM | POA: Diagnosis not present

## 2023-12-15 DIAGNOSIS — E11319 Type 2 diabetes mellitus with unspecified diabetic retinopathy without macular edema: Secondary | ICD-10-CM

## 2023-12-15 DIAGNOSIS — Z794 Long term (current) use of insulin: Secondary | ICD-10-CM | POA: Diagnosis not present

## 2023-12-15 LAB — POCT GLYCOSYLATED HEMOGLOBIN (HGB A1C): Hemoglobin A1C: 7.3 % — AB (ref 4.0–5.6)

## 2023-12-15 LAB — HM DIABETES FOOT EXAM

## 2023-12-15 NOTE — Assessment & Plan Note (Addendum)
 Chronic,  per pt report improved from last OV,  followed by ophthalmology

## 2023-12-15 NOTE — Progress Notes (Signed)
 Patient ID: [name], female    DOB: June 07, 1970, 53 y.o.   MRN: [id number]  This visit was conducted in person.  BP 102/62   Pulse 97   Temp 98.5 F (36.9 C) (Oral)   Ht 5' 5.25 (1.657 m)   Wt 123 lb 12.8 oz (56.2 kg)   SpO2 100%   BMI 20.44 kg/m    CC:  Chief Complaint  Patient presents with   Diabetes    Follow up     Subjective:   HPI: [name] is a 53 y.o. female presenting on 12/15/2023 for Diabetes (Follow up )  Diabetes: She is on Ozempic  2 mg weekly, metformin  500 mg XR 2 tabs daily and Lantus  25 units daily She has  continued to lose weight! Lab Results  Component Value Date   HGBA1C 7.3 (A) 12/15/2023  Using medications without difficulties: Hypoglycemic episodes: occ.. less with lower dose of Lantus . Hyperglycemic episodes:rare Feet problems: none Blood Sugars averaging:  using continuous glucose monitor, FBS 75-150.. higher with menses. eye exam within last year:  none Associated with diabetic retinopathy... per pt improved at last OV in 03/2023 Urine microalbumin test within normal range  Wt Readings from Last 3 Encounters:  12/15/23 123 lb 12.8 oz (56.2 kg)  06/15/23 126 lb (57.2 kg)  03/19/23 128 lb 4 oz (58.2 kg)  Body mass index is 20.44 kg/m.  BP Readings from Last 3 Encounters:  12/15/23 102/62  06/15/23 90/62  03/19/23 110/72    Due for foot exam.      Relevant past medical, surgical, family and social history reviewed and updated as indicated. Interim medical history since our last visit reviewed. Allergies and medications reviewed and updated. Outpatient Medications Prior to Visit  Medication Sig Dispense Refill   atorvastatin  (LIPITOR) 20 MG tablet TAKE 1 TABLET(20 MG) BY MOUTH DAILY 90 tablet 3   Continuous Glucose Sensor (DEXCOM G6 SENSOR) MISC USE TO CHECK BLOOD SUGAR CONTINUOUSLY, CHANGE EVERY 10 DAYS 3 each 0   Continuous Glucose Transmitter (DEXCOM G6 TRANSMITTER) MISC Check sugar continuously 1 each 3    insulin  glargine (LANTUS  SOLOSTAR) 100 UNIT/ML Solostar Pen Inject 25 Units into the skin daily.     Insulin  Pen Needle (BD PEN NEEDLE NANO U/F) 32G X 4 MM MISC USE TO INJECT INSULIN  DAILY 100 each 3   metFORMIN  (GLUCOPHAGE -XR) 500 MG 24 hr tablet TAKE 2 TABLETS(1000 MG) BY MOUTH DAILY WITH BREAKFAST 180 tablet 3   Multiple Vitamin (MULTIVITAMIN) capsule Take 1 capsule by mouth daily.     Semaglutide , 2 MG/DOSE, (OZEMPIC , 2 MG/DOSE,) 8 MG/3ML SOPN INJECT 2 MG UNDER THE SKIN ONCE A WEEK 9 mL 3   No facility-administered medications prior to visit.     Per HPI unless specifically indicated in ROS section below Review of Systems  Constitutional:  Negative for fatigue and fever.  HENT:  Negative for congestion.   Eyes:  Negative for pain.  Respiratory:  Negative for cough and shortness of breath.   Cardiovascular:  Negative for chest pain, palpitations and leg swelling.  Gastrointestinal:  Negative for abdominal pain.  Genitourinary:  Negative for dysuria and vaginal bleeding.  Musculoskeletal:  Negative for back pain.  Neurological:  Negative for syncope, light-headedness and headaches.  Psychiatric/Behavioral:  Negative for dysphoric mood.    Objective:  BP 102/62   Pulse 97   Temp 98.5 F (36.9 C) (Oral)   Ht 5' 5.25 (1.657 m)   Wt 123 lb 12.8  oz (56.2 kg)   SpO2 100%   BMI 20.44 kg/m   Wt Readings from Last 3 Encounters:  12/15/23 123 lb 12.8 oz (56.2 kg)  06/15/23 126 lb (57.2 kg)  03/19/23 128 lb 4 oz (58.2 kg)      Physical Exam Vitals and nursing note reviewed.  Constitutional:      General: She is not in acute distress.    Appearance: Normal appearance. She is well-developed. She is not ill-appearing or toxic-appearing.  HENT:     Head: Normocephalic.     Right Ear: Hearing, tympanic membrane, ear canal and external ear normal.     Left Ear: Hearing, tympanic membrane, ear canal and external ear normal.     Nose: Nose normal.  Eyes:     General: Lids are normal.  Lids are everted, no foreign bodies appreciated.     Conjunctiva/sclera: Conjunctivae normal.     Pupils: Pupils are equal, round, and reactive to light.  Neck:     Thyroid: No thyroid mass or thyromegaly.     Vascular: No carotid bruit.     Trachea: Trachea normal.  Cardiovascular:     Rate and Rhythm: Normal rate and regular rhythm.     Heart sounds: Normal heart sounds, S1 normal and S2 normal. No murmur heard.    No gallop.  Pulmonary:     Effort: Pulmonary effort is normal. No respiratory distress.     Breath sounds: Normal breath sounds. No wheezing, rhonchi or rales.  Abdominal:     General: Bowel sounds are normal. There is no distension or abdominal bruit.     Palpations: Abdomen is soft. There is no fluid wave or mass.     Tenderness: There is no abdominal tenderness. There is no guarding or rebound.     Hernia: No hernia is present.  Musculoskeletal:     Cervical back: Normal range of motion and neck supple.  Lymphadenopathy:     Cervical: No cervical adenopathy.  Skin:    General: Skin is warm and dry.     Findings: No rash.  Neurological:     Mental Status: She is alert.     Cranial Nerves: No cranial nerve deficit.     Sensory: No sensory deficit.  Psychiatric:        Mood and Affect: Mood is not anxious or depressed.        Speech: Speech normal.        Behavior: Behavior normal. Behavior is cooperative.        Judgment: Judgment normal.    Diabetic foot exam: Normal inspection No skin breakdown No calluses  Normal DP pulses Normal sensation to light touch and monofilament Nails normal     Results for orders placed or performed in visit on 12/15/23  HM DIABETES FOOT EXAM   Collection Time: 12/15/23 12:00 AM  Result Value Ref Range   HM Diabetic Foot Exam done   POCT glycosylated hemoglobin (Hb A1C)   Collection Time: 12/15/23  8:35 AM  Result Value Ref Range   Hemoglobin A1C 7.3 (A) 4.0 - 5.6 %   HbA1c POC (<> result, manual entry)     HbA1c, POC  (prediabetic range)     HbA1c, POC (controlled diabetic range)       COVID 19 screen:  No recent travel or known exposure to COVID19 The patient denies respiratory symptoms of COVID 19 at this time. The importance of social distancing was discussed today.   Assessment and Plan  Problem List Items Addressed This Visit     Diabetes mellitus type 2 with retinopathy (HCC) - Primary   Chronic improved control  Ozempic  0.5 mg weekly, metformin  500 mg XR 2 tabs daily and Semglee  47 units daily      Relevant Orders   POCT glycosylated hemoglobin (Hb A1C) (Completed)   Diabetic retinopathy associated with controlled type 2 diabetes mellitus (HCC)   Chronic,  per pt report improved from last OV,  followed by ophthalmology      No orders of the defined types were placed in this encounter.   Greig Ring, MD

## 2023-12-15 NOTE — Assessment & Plan Note (Addendum)
 Chronic,   stable control.  Ozempic  0.5 mg weekly, metformin  500 mg XR 2 tabs daily and Semglee  47 units daily

## 2023-12-21 ENCOUNTER — Encounter: Payer: Self-pay | Admitting: Family Medicine

## 2023-12-22 ENCOUNTER — Encounter: Payer: Self-pay | Admitting: Family Medicine

## 2023-12-24 ENCOUNTER — Other Ambulatory Visit: Payer: Self-pay | Admitting: Family Medicine

## 2024-03-28 ENCOUNTER — Other Ambulatory Visit: Payer: Self-pay | Admitting: Family Medicine

## 2024-03-29 ENCOUNTER — Other Ambulatory Visit: Payer: Self-pay | Admitting: Family Medicine

## 2024-03-29 DIAGNOSIS — E11319 Type 2 diabetes mellitus with unspecified diabetic retinopathy without macular edema: Secondary | ICD-10-CM

## 2024-03-29 DIAGNOSIS — E1169 Type 2 diabetes mellitus with other specified complication: Secondary | ICD-10-CM

## 2024-03-29 DIAGNOSIS — Z114 Encounter for screening for human immunodeficiency virus [HIV]: Secondary | ICD-10-CM

## 2024-03-29 NOTE — Telephone Encounter (Signed)
 Please schedule CPE with fasting labs prior for Dr. Ermalene Searing.

## 2024-03-29 NOTE — Telephone Encounter (Signed)
 lvm for pt to call office to schedule appt.

## 2024-03-30 ENCOUNTER — Other Ambulatory Visit (HOSPITAL_COMMUNITY): Payer: Self-pay

## 2024-03-30 ENCOUNTER — Telehealth: Payer: Self-pay

## 2024-03-30 NOTE — Telephone Encounter (Signed)
 Called and schedule pt for Cpe / labs

## 2024-03-30 NOTE — Telephone Encounter (Signed)
 Pharmacy Patient Advocate Encounter   Received notification from [facility] that prior authorization for Ozempic  8 is required/requested.   Insurance verification completed.   The patient is insured through [facility].   Per test claim: PA required; PA started via CoverMyMeds. KEY [id number] . Waiting for clinical questions to populate.

## 2024-03-30 NOTE — Telephone Encounter (Signed)
 Clinical questions have been answered and PA submitted. PA currently Pending. Please be advised that most companies allow up to 30 days to make a decision. We will advise when a determination has been made, or follow up in 1 week.   Please reach out to our team, Rx Prior Auth Pool, if you haven't heard back in a week.

## 2024-03-31 ENCOUNTER — Other Ambulatory Visit (HOSPITAL_COMMUNITY): Payer: Self-pay

## 2024-03-31 NOTE — Telephone Encounter (Signed)
 Pharmacy Patient Advocate Encounter  Received notification from [facility] that Prior Authorization for Ozempic  8 has been APPROVED from 03/30/24 to 03/30/27. Ran test claim, Copay is $90.00 for a 84 day supply. This test claim was processed through [facility]- copay amounts may vary at other pharmacies due to pharmacy/plan contracts, or as the patient moves through the different stages of their insurance plan.   PA #/Case ID/Reference #: # [id number]

## 2024-04-12 ENCOUNTER — Other Ambulatory Visit

## 2024-04-19 ENCOUNTER — Encounter: Admitting: Family Medicine
